# Patient Record
Sex: Female | Born: 1980 | Race: White | Hispanic: No | Marital: Married | Smoking: Never smoker
Health system: Southern US, Community
[De-identification: ages and names within clinical notes are randomized; demographics above are authoritative.]

## PROBLEM LIST (undated history)

## (undated) DIAGNOSIS — I82409 Acute embolism and thrombosis of unspecified deep veins of unspecified lower extremity: Secondary | ICD-10-CM

## (undated) DIAGNOSIS — Z9889 Other specified postprocedural states: Secondary | ICD-10-CM

## (undated) DIAGNOSIS — D682 Hereditary deficiency of other clotting factors: Secondary | ICD-10-CM

## (undated) DIAGNOSIS — I2699 Other pulmonary embolism without acute cor pulmonale: Secondary | ICD-10-CM

## (undated) DIAGNOSIS — R112 Nausea with vomiting, unspecified: Secondary | ICD-10-CM

## (undated) DIAGNOSIS — D6859 Other primary thrombophilia: Secondary | ICD-10-CM

## (undated) HISTORY — PX: TONSILLECTOMY: SUR1361

## (undated) HISTORY — PX: HYSTEROTOMY: SHX1776

## (undated) HISTORY — PX: APPENDECTOMY: SHX54

---

## 2003-03-14 ENCOUNTER — Ambulatory Visit (HOSPITAL_COMMUNITY): Admission: RE | Admit: 2003-03-14 | Discharge: 2003-03-14 | Payer: Self-pay | Admitting: Orthopedic Surgery

## 2003-03-16 ENCOUNTER — Inpatient Hospital Stay (HOSPITAL_COMMUNITY): Admission: EM | Admit: 2003-03-16 | Discharge: 2003-03-20 | Payer: Self-pay | Admitting: Emergency Medicine

## 2003-03-25 ENCOUNTER — Encounter: Admission: RE | Admit: 2003-03-25 | Discharge: 2003-03-25 | Payer: Self-pay | Admitting: Family Medicine

## 2003-03-27 ENCOUNTER — Encounter: Admission: RE | Admit: 2003-03-27 | Discharge: 2003-03-27 | Payer: Self-pay | Admitting: Family Medicine

## 2003-03-29 ENCOUNTER — Encounter: Admission: RE | Admit: 2003-03-29 | Discharge: 2003-03-29 | Payer: Self-pay | Admitting: Family Medicine

## 2003-04-01 ENCOUNTER — Encounter: Admission: RE | Admit: 2003-04-01 | Discharge: 2003-04-01 | Payer: Self-pay | Admitting: Family Medicine

## 2003-04-03 ENCOUNTER — Encounter: Admission: RE | Admit: 2003-04-03 | Discharge: 2003-04-03 | Payer: Self-pay | Admitting: Family Medicine

## 2003-04-08 ENCOUNTER — Encounter: Admission: RE | Admit: 2003-04-08 | Discharge: 2003-04-08 | Payer: Self-pay | Admitting: Family Medicine

## 2003-04-15 ENCOUNTER — Encounter: Admission: RE | Admit: 2003-04-15 | Discharge: 2003-04-15 | Payer: Self-pay | Admitting: Family Medicine

## 2003-04-25 ENCOUNTER — Encounter: Admission: RE | Admit: 2003-04-25 | Discharge: 2003-04-25 | Payer: Self-pay | Admitting: Family Medicine

## 2003-04-27 DIAGNOSIS — I82409 Acute embolism and thrombosis of unspecified deep veins of unspecified lower extremity: Secondary | ICD-10-CM

## 2003-04-27 HISTORY — DX: Acute embolism and thrombosis of unspecified deep veins of unspecified lower extremity: I82.409

## 2003-05-02 ENCOUNTER — Encounter: Admission: RE | Admit: 2003-05-02 | Discharge: 2003-05-02 | Payer: Self-pay | Admitting: Family Medicine

## 2003-05-10 ENCOUNTER — Encounter: Admission: RE | Admit: 2003-05-10 | Discharge: 2003-05-10 | Payer: Self-pay | Admitting: Family Medicine

## 2003-05-23 ENCOUNTER — Encounter: Admission: RE | Admit: 2003-05-23 | Discharge: 2003-05-23 | Payer: Self-pay | Admitting: Sports Medicine

## 2003-06-06 ENCOUNTER — Encounter: Admission: RE | Admit: 2003-06-06 | Discharge: 2003-06-06 | Payer: Self-pay | Admitting: Sports Medicine

## 2003-06-13 ENCOUNTER — Encounter: Admission: RE | Admit: 2003-06-13 | Discharge: 2003-06-13 | Payer: Self-pay | Admitting: Sports Medicine

## 2003-06-28 ENCOUNTER — Encounter: Admission: RE | Admit: 2003-06-28 | Discharge: 2003-06-28 | Payer: Self-pay | Admitting: Family Medicine

## 2003-07-22 ENCOUNTER — Encounter: Admission: RE | Admit: 2003-07-22 | Discharge: 2003-07-22 | Payer: Self-pay | Admitting: Family Medicine

## 2003-08-05 ENCOUNTER — Encounter: Admission: RE | Admit: 2003-08-05 | Discharge: 2003-08-05 | Payer: Self-pay | Admitting: Family Medicine

## 2003-08-06 ENCOUNTER — Encounter: Admission: RE | Admit: 2003-08-06 | Discharge: 2003-08-06 | Payer: Self-pay | Admitting: Sports Medicine

## 2003-08-23 ENCOUNTER — Encounter: Admission: RE | Admit: 2003-08-23 | Discharge: 2003-08-23 | Payer: Self-pay | Admitting: Family Medicine

## 2003-09-06 ENCOUNTER — Encounter: Admission: RE | Admit: 2003-09-06 | Discharge: 2003-09-06 | Payer: Self-pay | Admitting: Family Medicine

## 2003-09-26 ENCOUNTER — Encounter: Admission: RE | Admit: 2003-09-26 | Discharge: 2003-09-26 | Payer: Self-pay | Admitting: Family Medicine

## 2003-11-06 ENCOUNTER — Encounter: Admission: RE | Admit: 2003-11-06 | Discharge: 2003-11-06 | Payer: Self-pay | Admitting: Family Medicine

## 2003-12-09 ENCOUNTER — Encounter: Admission: RE | Admit: 2003-12-09 | Discharge: 2003-12-09 | Payer: Self-pay | Admitting: Family Medicine

## 2003-12-09 ENCOUNTER — Encounter: Admission: RE | Admit: 2003-12-09 | Discharge: 2003-12-09 | Payer: Self-pay | Admitting: Sports Medicine

## 2004-04-14 ENCOUNTER — Emergency Department (HOSPITAL_COMMUNITY): Admission: EM | Admit: 2004-04-14 | Discharge: 2004-04-14 | Payer: Self-pay | Admitting: Emergency Medicine

## 2004-05-06 ENCOUNTER — Ambulatory Visit: Payer: Self-pay | Admitting: Family Medicine

## 2004-05-15 ENCOUNTER — Ambulatory Visit: Payer: Self-pay | Admitting: Family Medicine

## 2004-05-15 ENCOUNTER — Inpatient Hospital Stay (HOSPITAL_COMMUNITY): Admission: EM | Admit: 2004-05-15 | Discharge: 2004-05-16 | Payer: Self-pay | Admitting: Emergency Medicine

## 2004-05-19 ENCOUNTER — Ambulatory Visit: Payer: Self-pay | Admitting: Family Medicine

## 2004-05-22 ENCOUNTER — Ambulatory Visit: Payer: Self-pay | Admitting: Family Medicine

## 2004-05-27 ENCOUNTER — Ambulatory Visit: Payer: Self-pay | Admitting: Family Medicine

## 2004-06-01 ENCOUNTER — Ambulatory Visit: Payer: Self-pay | Admitting: Family Medicine

## 2004-06-05 ENCOUNTER — Ambulatory Visit: Payer: Self-pay | Admitting: Sports Medicine

## 2004-06-10 ENCOUNTER — Ambulatory Visit: Payer: Self-pay | Admitting: Family Medicine

## 2004-06-15 ENCOUNTER — Ambulatory Visit: Payer: Self-pay | Admitting: Family Medicine

## 2004-06-25 ENCOUNTER — Ambulatory Visit: Payer: Self-pay | Admitting: Family Medicine

## 2004-07-01 ENCOUNTER — Ambulatory Visit: Payer: Self-pay | Admitting: Family Medicine

## 2004-07-08 ENCOUNTER — Ambulatory Visit: Payer: Self-pay | Admitting: Family Medicine

## 2004-07-15 ENCOUNTER — Ambulatory Visit: Payer: Self-pay | Admitting: Family Medicine

## 2004-07-30 ENCOUNTER — Ambulatory Visit: Payer: Self-pay | Admitting: Sports Medicine

## 2004-08-06 ENCOUNTER — Ambulatory Visit: Payer: Self-pay | Admitting: Family Medicine

## 2004-08-11 ENCOUNTER — Ambulatory Visit: Payer: Self-pay | Admitting: Family Medicine

## 2004-08-17 ENCOUNTER — Ambulatory Visit: Payer: Self-pay | Admitting: Sports Medicine

## 2004-08-27 ENCOUNTER — Ambulatory Visit: Payer: Self-pay | Admitting: Family Medicine

## 2004-09-08 ENCOUNTER — Ambulatory Visit: Payer: Self-pay | Admitting: Family Medicine

## 2004-09-15 ENCOUNTER — Ambulatory Visit: Payer: Self-pay | Admitting: Family Medicine

## 2004-09-28 ENCOUNTER — Ambulatory Visit: Payer: Self-pay | Admitting: Sports Medicine

## 2004-09-30 ENCOUNTER — Encounter: Admission: RE | Admit: 2004-09-30 | Discharge: 2004-09-30 | Payer: Self-pay | Admitting: Obstetrics and Gynecology

## 2004-10-04 ENCOUNTER — Emergency Department (HOSPITAL_COMMUNITY): Admission: EM | Admit: 2004-10-04 | Discharge: 2004-10-05 | Payer: Self-pay | Admitting: Emergency Medicine

## 2004-10-21 ENCOUNTER — Ambulatory Visit: Payer: Self-pay | Admitting: Family Medicine

## 2004-10-29 ENCOUNTER — Ambulatory Visit: Payer: Self-pay | Admitting: Family Medicine

## 2004-11-13 ENCOUNTER — Ambulatory Visit: Payer: Self-pay | Admitting: Family Medicine

## 2004-12-03 ENCOUNTER — Ambulatory Visit: Payer: Self-pay | Admitting: Family Medicine

## 2004-12-29 ENCOUNTER — Ambulatory Visit: Payer: Self-pay | Admitting: Family Medicine

## 2005-01-07 ENCOUNTER — Ambulatory Visit: Payer: Self-pay | Admitting: Family Medicine

## 2005-01-20 ENCOUNTER — Ambulatory Visit: Payer: Self-pay | Admitting: Family Medicine

## 2005-01-29 ENCOUNTER — Encounter (INDEPENDENT_AMBULATORY_CARE_PROVIDER_SITE_OTHER): Payer: Self-pay | Admitting: *Deleted

## 2005-01-29 LAB — CONVERTED CEMR LAB

## 2005-02-19 ENCOUNTER — Ambulatory Visit: Payer: Self-pay | Admitting: Family Medicine

## 2005-03-23 ENCOUNTER — Ambulatory Visit: Payer: Self-pay | Admitting: Sports Medicine

## 2005-04-07 ENCOUNTER — Ambulatory Visit: Payer: Self-pay | Admitting: Family Medicine

## 2005-05-18 ENCOUNTER — Ambulatory Visit: Payer: Self-pay | Admitting: Family Medicine

## 2005-05-26 ENCOUNTER — Emergency Department (HOSPITAL_COMMUNITY): Admission: EM | Admit: 2005-05-26 | Discharge: 2005-05-26 | Payer: Self-pay | Admitting: Emergency Medicine

## 2005-07-08 ENCOUNTER — Ambulatory Visit: Payer: Self-pay | Admitting: Family Medicine

## 2005-08-17 ENCOUNTER — Ambulatory Visit: Payer: Self-pay | Admitting: Family Medicine

## 2005-08-20 ENCOUNTER — Ambulatory Visit: Payer: Self-pay | Admitting: Family Medicine

## 2005-08-25 ENCOUNTER — Ambulatory Visit: Payer: Self-pay | Admitting: Family Medicine

## 2005-09-03 ENCOUNTER — Ambulatory Visit: Payer: Self-pay | Admitting: Family Medicine

## 2005-10-13 ENCOUNTER — Ambulatory Visit: Payer: Self-pay | Admitting: Family Medicine

## 2005-11-10 ENCOUNTER — Ambulatory Visit: Payer: Self-pay | Admitting: Family Medicine

## 2006-02-15 ENCOUNTER — Ambulatory Visit: Payer: Self-pay | Admitting: Family Medicine

## 2006-02-17 ENCOUNTER — Ambulatory Visit: Payer: Self-pay | Admitting: Family Medicine

## 2006-02-28 ENCOUNTER — Ambulatory Visit: Payer: Self-pay | Admitting: Sports Medicine

## 2006-03-02 ENCOUNTER — Emergency Department (HOSPITAL_COMMUNITY): Admission: EM | Admit: 2006-03-02 | Discharge: 2006-03-02 | Payer: Self-pay | Admitting: Emergency Medicine

## 2006-03-30 ENCOUNTER — Ambulatory Visit: Payer: Self-pay | Admitting: Family Medicine

## 2006-04-26 HISTORY — PX: DIAGNOSTIC LAPAROSCOPY: SUR761

## 2006-04-28 ENCOUNTER — Ambulatory Visit: Payer: Self-pay | Admitting: Family Medicine

## 2006-05-12 ENCOUNTER — Ambulatory Visit: Payer: Self-pay | Admitting: Family Medicine

## 2006-05-26 ENCOUNTER — Ambulatory Visit: Payer: Self-pay | Admitting: Family Medicine

## 2006-06-01 ENCOUNTER — Ambulatory Visit: Payer: Self-pay | Admitting: Family Medicine

## 2006-06-15 ENCOUNTER — Emergency Department (HOSPITAL_COMMUNITY): Admission: EM | Admit: 2006-06-15 | Discharge: 2006-06-15 | Payer: Self-pay | Admitting: Emergency Medicine

## 2006-06-22 ENCOUNTER — Ambulatory Visit: Payer: Self-pay | Admitting: Family Medicine

## 2006-06-23 DIAGNOSIS — N921 Excessive and frequent menstruation with irregular cycle: Secondary | ICD-10-CM | POA: Insufficient documentation

## 2006-06-23 DIAGNOSIS — N920 Excessive and frequent menstruation with regular cycle: Secondary | ICD-10-CM

## 2006-06-23 DIAGNOSIS — E282 Polycystic ovarian syndrome: Secondary | ICD-10-CM

## 2006-06-23 DIAGNOSIS — M542 Cervicalgia: Secondary | ICD-10-CM

## 2006-06-23 DIAGNOSIS — I80299 Phlebitis and thrombophlebitis of other deep vessels of unspecified lower extremity: Secondary | ICD-10-CM

## 2006-06-23 DIAGNOSIS — F329 Major depressive disorder, single episode, unspecified: Secondary | ICD-10-CM

## 2006-06-23 DIAGNOSIS — E669 Obesity, unspecified: Secondary | ICD-10-CM | POA: Insufficient documentation

## 2006-06-24 ENCOUNTER — Encounter (INDEPENDENT_AMBULATORY_CARE_PROVIDER_SITE_OTHER): Payer: Self-pay | Admitting: *Deleted

## 2006-07-08 ENCOUNTER — Ambulatory Visit: Payer: Self-pay | Admitting: Family Medicine

## 2006-07-08 DIAGNOSIS — I2699 Other pulmonary embolism without acute cor pulmonale: Secondary | ICD-10-CM

## 2006-07-08 LAB — CONVERTED CEMR LAB
INR: 2.7
Prothrombin Time: 32.9 s

## 2006-07-29 ENCOUNTER — Encounter (INDEPENDENT_AMBULATORY_CARE_PROVIDER_SITE_OTHER): Payer: Self-pay | Admitting: *Deleted

## 2006-07-29 ENCOUNTER — Ambulatory Visit: Payer: Self-pay | Admitting: Family Medicine

## 2006-07-29 LAB — CONVERTED CEMR LAB: INR: 2.1

## 2006-08-01 ENCOUNTER — Encounter (INDEPENDENT_AMBULATORY_CARE_PROVIDER_SITE_OTHER): Payer: Self-pay | Admitting: *Deleted

## 2006-08-01 ENCOUNTER — Telehealth (INDEPENDENT_AMBULATORY_CARE_PROVIDER_SITE_OTHER): Payer: Self-pay | Admitting: *Deleted

## 2006-08-02 ENCOUNTER — Encounter: Payer: Self-pay | Admitting: *Deleted

## 2006-08-03 ENCOUNTER — Encounter (INDEPENDENT_AMBULATORY_CARE_PROVIDER_SITE_OTHER): Payer: Self-pay | Admitting: *Deleted

## 2006-08-03 ENCOUNTER — Encounter: Payer: Self-pay | Admitting: *Deleted

## 2006-08-03 LAB — CONVERTED CEMR LAB: Pap Smear: NORMAL

## 2006-08-09 ENCOUNTER — Encounter (INDEPENDENT_AMBULATORY_CARE_PROVIDER_SITE_OTHER): Payer: Self-pay | Admitting: *Deleted

## 2006-08-09 ENCOUNTER — Ambulatory Visit (HOSPITAL_COMMUNITY): Admission: RE | Admit: 2006-08-09 | Discharge: 2006-08-09 | Payer: Self-pay | Admitting: *Deleted

## 2006-08-10 ENCOUNTER — Telehealth (INDEPENDENT_AMBULATORY_CARE_PROVIDER_SITE_OTHER): Payer: Self-pay | Admitting: *Deleted

## 2006-08-16 ENCOUNTER — Telehealth (INDEPENDENT_AMBULATORY_CARE_PROVIDER_SITE_OTHER): Payer: Self-pay | Admitting: *Deleted

## 2006-08-22 ENCOUNTER — Encounter: Payer: Self-pay | Admitting: *Deleted

## 2006-08-24 ENCOUNTER — Telehealth: Payer: Self-pay | Admitting: *Deleted

## 2006-08-25 ENCOUNTER — Ambulatory Visit: Payer: Self-pay | Admitting: Sports Medicine

## 2006-08-25 ENCOUNTER — Telehealth: Payer: Self-pay | Admitting: *Deleted

## 2006-08-25 ENCOUNTER — Encounter: Payer: Self-pay | Admitting: Family Medicine

## 2006-08-25 DIAGNOSIS — H9209 Otalgia, unspecified ear: Secondary | ICD-10-CM | POA: Insufficient documentation

## 2006-08-25 DIAGNOSIS — R197 Diarrhea, unspecified: Secondary | ICD-10-CM

## 2006-08-31 ENCOUNTER — Ambulatory Visit: Payer: Self-pay | Admitting: Family Medicine

## 2006-08-31 LAB — CONVERTED CEMR LAB: INR: 2.2

## 2006-09-02 ENCOUNTER — Encounter (INDEPENDENT_AMBULATORY_CARE_PROVIDER_SITE_OTHER): Payer: Self-pay | Admitting: *Deleted

## 2006-09-05 ENCOUNTER — Ambulatory Visit: Payer: Self-pay | Admitting: Sports Medicine

## 2006-09-06 ENCOUNTER — Telehealth (INDEPENDENT_AMBULATORY_CARE_PROVIDER_SITE_OTHER): Payer: Self-pay | Admitting: *Deleted

## 2006-09-06 ENCOUNTER — Ambulatory Visit: Payer: Self-pay | Admitting: Oncology

## 2006-09-08 ENCOUNTER — Ambulatory Visit: Payer: Self-pay | Admitting: Obstetrics and Gynecology

## 2006-09-13 LAB — PROTIME-INR (CHCC SATELLITE): INR: 3.2 (ref 2.0–3.5)

## 2006-09-13 LAB — CBC WITH DIFFERENTIAL (CANCER CENTER ONLY)
BASO#: 0 10*3/uL (ref 0.0–0.2)
EOS%: 2.9 % (ref 0.0–7.0)
Eosinophils Absolute: 0.2 10*3/uL (ref 0.0–0.5)
HGB: 13.1 g/dL (ref 11.6–15.9)
LYMPH#: 2.1 10*3/uL (ref 0.9–3.3)
NEUT#: 3.1 10*3/uL (ref 1.5–6.5)
RBC: 4.36 10*6/uL (ref 3.70–5.32)
WBC: 5.6 10*3/uL (ref 3.9–10.0)

## 2006-09-16 LAB — COMPREHENSIVE METABOLIC PANEL
ALT: 23 U/L (ref 0–35)
AST: 22 U/L (ref 0–37)
Alkaline Phosphatase: 63 U/L (ref 39–117)
CO2: 24 mEq/L (ref 19–32)
Sodium: 137 mEq/L (ref 135–145)
Total Bilirubin: 0.4 mg/dL (ref 0.3–1.2)
Total Protein: 6.4 g/dL (ref 6.0–8.3)

## 2006-09-16 LAB — HYPERCOAGULABLE PANEL, COMPREHENSIVE
AntiThromb III Func: 102 % (ref 75–120)
Anticardiolipin IgA: <7 [APL'U] (ref <13)
DRVVT 1:1 Mix: 42.6 secs (ref 36.1–47.0)
DRVVT: 88.7 secs — ABNORMAL HIGH (ref 36.1–47.0)
Homocysteine: 5.8 umol/L (ref 4.0–15.4)
Lupus Anticoagulant: DETECTED
PTTLA Confirmation: 14.8 secs — ABNORMAL HIGH (ref <8.0)
Protein C Activity: 15 % — ABNORMAL LOW (ref 91–147)
Protein C, Total: 39 % — ABNORMAL LOW (ref 70–140)
Protein S Activity: 42 % — ABNORMAL LOW (ref 81–180)
Protein S Ag, Total: 68 % — ABNORMAL LOW (ref 70–140)

## 2006-09-20 ENCOUNTER — Telehealth: Payer: Self-pay | Admitting: *Deleted

## 2006-09-21 ENCOUNTER — Encounter (INDEPENDENT_AMBULATORY_CARE_PROVIDER_SITE_OTHER): Payer: Self-pay | Admitting: *Deleted

## 2006-09-21 ENCOUNTER — Ambulatory Visit: Payer: Self-pay | Admitting: Family Medicine

## 2006-09-29 ENCOUNTER — Ambulatory Visit: Payer: Self-pay | Admitting: Family Medicine

## 2006-09-29 LAB — CONVERTED CEMR LAB: INR: 2.3

## 2006-11-18 ENCOUNTER — Ambulatory Visit: Payer: Self-pay | Admitting: Family Medicine

## 2006-11-18 LAB — CONVERTED CEMR LAB: INR: 4.3

## 2006-11-21 ENCOUNTER — Encounter (INDEPENDENT_AMBULATORY_CARE_PROVIDER_SITE_OTHER): Payer: Self-pay | Admitting: *Deleted

## 2006-11-23 ENCOUNTER — Encounter (INDEPENDENT_AMBULATORY_CARE_PROVIDER_SITE_OTHER): Payer: Self-pay | Admitting: *Deleted

## 2006-11-23 DIAGNOSIS — R7402 Elevation of levels of lactic acid dehydrogenase (LDH): Secondary | ICD-10-CM | POA: Insufficient documentation

## 2006-11-23 DIAGNOSIS — R740 Nonspecific elevation of levels of transaminase and lactic acid dehydrogenase [LDH]: Secondary | ICD-10-CM

## 2006-11-25 ENCOUNTER — Ambulatory Visit: Payer: Self-pay | Admitting: Family Medicine

## 2006-11-25 ENCOUNTER — Encounter (INDEPENDENT_AMBULATORY_CARE_PROVIDER_SITE_OTHER): Payer: Self-pay | Admitting: *Deleted

## 2006-11-25 DIAGNOSIS — R42 Dizziness and giddiness: Secondary | ICD-10-CM

## 2006-11-28 LAB — CONVERTED CEMR LAB
ALT: 40 units/L — ABNORMAL HIGH (ref 0–35)
AST: 25 units/L (ref 0–37)
Albumin: 4.6 g/dL (ref 3.5–5.2)
Alkaline Phosphatase: 64 units/L (ref 39–117)
BUN: 11 mg/dL (ref 6–23)
Basophils Absolute: 0 10*3/uL (ref 0.0–0.1)
Basophils Relative: 1 % (ref 0–1)
CO2: 23 meq/L (ref 19–32)
Calcium: 9.5 mg/dL (ref 8.4–10.5)
Chloride: 105 meq/L (ref 96–112)
Creatinine, Ser: 0.8 mg/dL (ref 0.40–1.20)
Eosinophils Absolute: 0.1 10*3/uL (ref 0.0–0.7)
Eosinophils Relative: 2 % (ref 0–5)
Glucose, Bld: 66 mg/dL — ABNORMAL LOW (ref 70–99)
HCT: 41.9 % (ref 36.0–46.0)
Hemoglobin: 14.2 g/dL (ref 12.0–15.0)
Lymphocytes Relative: 29 % (ref 12–46)
Lymphs Abs: 1.6 10*3/uL (ref 0.7–3.3)
MCHC: 33.9 g/dL (ref 30.0–36.0)
MCV: 88.8 fL (ref 78.0–100.0)
Monocytes Absolute: 0.5 10*3/uL (ref 0.2–0.7)
Monocytes Relative: 9 % (ref 3–11)
Neutro Abs: 3.3 10*3/uL (ref 1.7–7.7)
Neutrophils Relative %: 60 % (ref 43–77)
Platelets: 206 10*3/uL (ref 150–400)
Potassium: 4.5 meq/L (ref 3.5–5.3)
RBC: 4.72 M/uL (ref 3.87–5.11)
RDW: 13 % (ref 11.5–14.0)
Sodium: 143 meq/L (ref 135–145)
Total Bilirubin: 0.4 mg/dL (ref 0.3–1.2)
Total Protein: 7.2 g/dL (ref 6.0–8.3)
WBC: 5.6 10*3/uL (ref 4.0–10.5)
aPTT: 44 s — ABNORMAL HIGH (ref 24–37)

## 2006-11-30 ENCOUNTER — Encounter (INDEPENDENT_AMBULATORY_CARE_PROVIDER_SITE_OTHER): Payer: Self-pay | Admitting: *Deleted

## 2006-12-01 ENCOUNTER — Telehealth (INDEPENDENT_AMBULATORY_CARE_PROVIDER_SITE_OTHER): Payer: Self-pay | Admitting: *Deleted

## 2006-12-05 ENCOUNTER — Telehealth (INDEPENDENT_AMBULATORY_CARE_PROVIDER_SITE_OTHER): Payer: Self-pay | Admitting: *Deleted

## 2006-12-06 ENCOUNTER — Encounter (INDEPENDENT_AMBULATORY_CARE_PROVIDER_SITE_OTHER): Payer: Self-pay | Admitting: *Deleted

## 2006-12-07 ENCOUNTER — Encounter (INDEPENDENT_AMBULATORY_CARE_PROVIDER_SITE_OTHER): Payer: Self-pay | Admitting: *Deleted

## 2006-12-09 ENCOUNTER — Ambulatory Visit: Payer: Self-pay | Admitting: Family Medicine

## 2006-12-09 LAB — CONVERTED CEMR LAB: INR: 2.1

## 2007-01-12 ENCOUNTER — Ambulatory Visit: Payer: Self-pay | Admitting: Family Medicine

## 2007-01-12 LAB — CONVERTED CEMR LAB: INR: 2.2

## 2007-02-01 ENCOUNTER — Ambulatory Visit (HOSPITAL_COMMUNITY): Admission: RE | Admit: 2007-02-01 | Discharge: 2007-02-01 | Payer: Self-pay | Admitting: Gynecology

## 2007-02-07 ENCOUNTER — Ambulatory Visit: Payer: Self-pay | Admitting: Family Medicine

## 2007-02-07 DIAGNOSIS — Z86718 Personal history of other venous thrombosis and embolism: Secondary | ICD-10-CM

## 2007-02-07 LAB — CONVERTED CEMR LAB: INR: 2.1

## 2007-02-08 ENCOUNTER — Telehealth (INDEPENDENT_AMBULATORY_CARE_PROVIDER_SITE_OTHER): Payer: Self-pay | Admitting: *Deleted

## 2007-03-01 ENCOUNTER — Telehealth (INDEPENDENT_AMBULATORY_CARE_PROVIDER_SITE_OTHER): Payer: Self-pay | Admitting: *Deleted

## 2007-03-22 ENCOUNTER — Ambulatory Visit: Payer: Self-pay | Admitting: Family Medicine

## 2007-03-22 LAB — CONVERTED CEMR LAB: INR: 1.6

## 2007-03-28 ENCOUNTER — Emergency Department (HOSPITAL_COMMUNITY): Admission: EM | Admit: 2007-03-28 | Discharge: 2007-03-29 | Payer: Self-pay | Admitting: Emergency Medicine

## 2007-03-28 ENCOUNTER — Telehealth (INDEPENDENT_AMBULATORY_CARE_PROVIDER_SITE_OTHER): Payer: Self-pay | Admitting: Family Medicine

## 2007-03-28 ENCOUNTER — Ambulatory Visit (HOSPITAL_COMMUNITY): Admission: RE | Admit: 2007-03-28 | Discharge: 2007-03-28 | Payer: Self-pay | Admitting: Gynecology

## 2007-03-31 ENCOUNTER — Telehealth (INDEPENDENT_AMBULATORY_CARE_PROVIDER_SITE_OTHER): Payer: Self-pay | Admitting: Family Medicine

## 2007-04-05 ENCOUNTER — Ambulatory Visit: Payer: Self-pay | Admitting: Family Medicine

## 2007-04-05 LAB — CONVERTED CEMR LAB: INR: 1.8

## 2007-04-12 ENCOUNTER — Ambulatory Visit: Payer: Self-pay | Admitting: Family Medicine

## 2007-04-18 ENCOUNTER — Ambulatory Visit: Payer: Self-pay | Admitting: Sports Medicine

## 2007-04-18 LAB — CONVERTED CEMR LAB: INR: 1.8

## 2007-04-25 ENCOUNTER — Encounter (INDEPENDENT_AMBULATORY_CARE_PROVIDER_SITE_OTHER): Payer: Self-pay | Admitting: *Deleted

## 2007-05-02 ENCOUNTER — Ambulatory Visit: Payer: Self-pay | Admitting: Family Medicine

## 2007-05-02 LAB — CONVERTED CEMR LAB: INR: 2

## 2007-05-18 ENCOUNTER — Encounter (INDEPENDENT_AMBULATORY_CARE_PROVIDER_SITE_OTHER): Payer: Self-pay | Admitting: Family Medicine

## 2007-09-27 ENCOUNTER — Ambulatory Visit: Payer: Self-pay | Admitting: Oncology

## 2007-10-02 LAB — CBC WITH DIFFERENTIAL (CANCER CENTER ONLY)
BASO#: 0.1 10*3/uL (ref 0.0–0.2)
EOS%: 4.6 % (ref 0.0–7.0)
HCT: 37.7 % (ref 34.8–46.6)
HGB: 13.1 g/dL (ref 11.6–15.9)
LYMPH#: 1.8 10*3/uL (ref 0.9–3.3)
LYMPH%: 34 % (ref 14.0–48.0)
MCHC: 34.8 g/dL (ref 32.0–36.0)
MCV: 87 fL (ref 81–101)
NEUT%: 54.2 % (ref 39.6–80.0)

## 2007-10-02 LAB — PROTIME-INR (CHCC SATELLITE): Protime: 22.8 Seconds — ABNORMAL HIGH (ref 10.6–13.4)

## 2007-10-04 LAB — PROTIME-INR (CHCC SATELLITE): INR: 1.3 — ABNORMAL LOW (ref 2.0–3.5)

## 2007-10-11 LAB — PROTIME-INR (CHCC SATELLITE)
INR: 1 — ABNORMAL LOW (ref 2.0–3.5)
Protime: 12 Seconds (ref 10.6–13.4)

## 2007-10-19 LAB — CBC WITH DIFFERENTIAL (CANCER CENTER ONLY)
BASO#: 0.1 10*3/uL (ref 0.0–0.2)
BASO%: 1.8 % (ref 0.0–2.0)
Eosinophils Absolute: 0.2 10*3/uL (ref 0.0–0.5)
HCT: 37.7 % (ref 34.8–46.6)
HGB: 13.1 g/dL (ref 11.6–15.9)
LYMPH#: 2.4 10*3/uL (ref 0.9–3.3)
MCV: 87 fL (ref 81–101)
MONO#: 0.4 10*3/uL (ref 0.1–0.9)
NEUT%: 53.6 % (ref 39.6–80.0)
RBC: 4.31 10*6/uL (ref 3.70–5.32)
RDW: 11.8 % (ref 10.5–14.6)
WBC: 6.9 10*3/uL (ref 3.9–10.0)

## 2007-10-19 LAB — PROTIME-INR (CHCC SATELLITE)
INR: 1.1 — ABNORMAL LOW (ref 2.0–3.5)
Protime: 13.2 Seconds (ref 10.6–13.4)

## 2007-10-23 LAB — PROTIME-INR (CHCC SATELLITE)

## 2007-10-26 LAB — PROTIME-INR (CHCC SATELLITE): Protime: 27.6 Seconds — ABNORMAL HIGH (ref 10.6–13.4)

## 2007-11-10 ENCOUNTER — Ambulatory Visit: Payer: Self-pay | Admitting: Oncology

## 2007-12-07 ENCOUNTER — Ambulatory Visit (HOSPITAL_COMMUNITY): Admission: RE | Admit: 2007-12-07 | Discharge: 2007-12-07 | Payer: Self-pay | Admitting: Gynecology

## 2008-11-14 ENCOUNTER — Ambulatory Visit: Payer: Self-pay | Admitting: Oncology

## 2008-11-18 LAB — COMPREHENSIVE METABOLIC PANEL
Albumin: 4 g/dL (ref 3.5–5.2)
BUN: 7 mg/dL (ref 6–23)
CO2: 30 mEq/L (ref 19–32)
Calcium: 9.3 mg/dL (ref 8.4–10.5)
Chloride: 104 mEq/L (ref 96–112)
Creatinine, Ser: 0.65 mg/dL (ref 0.40–1.20)

## 2008-11-18 LAB — CBC WITH DIFFERENTIAL (CANCER CENTER ONLY)
BASO%: 0.5 % (ref 0.0–2.0)
Eosinophils Absolute: 0.2 10*3/uL (ref 0.0–0.5)
LYMPH#: 2 10*3/uL (ref 0.9–3.3)
MONO#: 0.4 10*3/uL (ref 0.1–0.9)
Platelets: 227 10*3/uL (ref 145–400)
RBC: 4.26 10*6/uL (ref 3.70–5.32)
RDW: 12 % (ref 10.5–14.6)
WBC: 6 10*3/uL (ref 3.9–10.0)

## 2008-12-03 LAB — CBC WITH DIFFERENTIAL (CANCER CENTER ONLY)
BASO%: 0.7 % (ref 0.0–2.0)
Eosinophils Absolute: 0.2 10*3/uL (ref 0.0–0.5)
LYMPH%: 35.4 % (ref 14.0–48.0)
MCH: 30.6 pg (ref 26.0–34.0)
MCV: 88 fL (ref 81–101)
MONO#: 0.4 10*3/uL (ref 0.1–0.9)
MONO%: 6.9 % (ref 0.0–13.0)
NEUT#: 3.3 10*3/uL (ref 1.5–6.5)
Platelets: 214 10*3/uL (ref 145–400)
RBC: 4.36 10*6/uL (ref 3.70–5.32)
RDW: 12 % (ref 10.5–14.6)
WBC: 6 10*3/uL (ref 3.9–10.0)

## 2008-12-03 LAB — BASIC METABOLIC PANEL - CANCER CENTER ONLY
BUN, Bld: 13 mg/dL (ref 7–22)
CO2: 29 mEq/L (ref 18–33)
Calcium: 9.1 mg/dL (ref 8.0–10.3)
Creat: 0.6 mg/dl (ref 0.6–1.2)
Glucose, Bld: 86 mg/dL (ref 73–118)

## 2009-03-01 IMAGING — RF DG HYSTEROGRAM
6 series · 6 of 6 positions shown · non-contrast
Comparison: 02/01/07.

CLINICAL DATA: Infertility.  
 HYSTEROSALPINGOGRAM:
 Exam is performed by Dr. Moatshe.  Six images are submitted for interpretation.

[Series 1: run · 1 of 1 slices shown (1 of 6)]
[im 1/1]
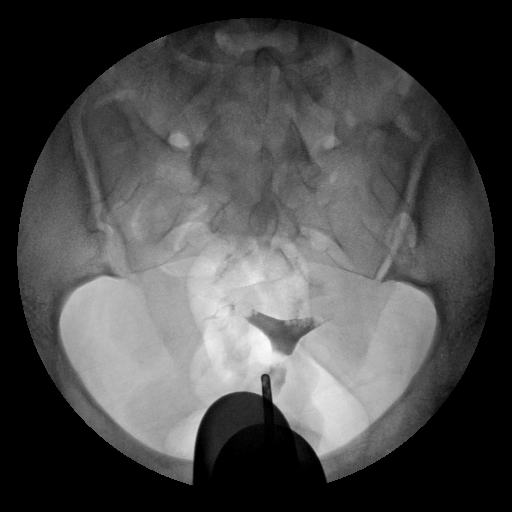

[Series 2: run · 1 of 1 slices shown (2 of 6)]
[im 1/1]
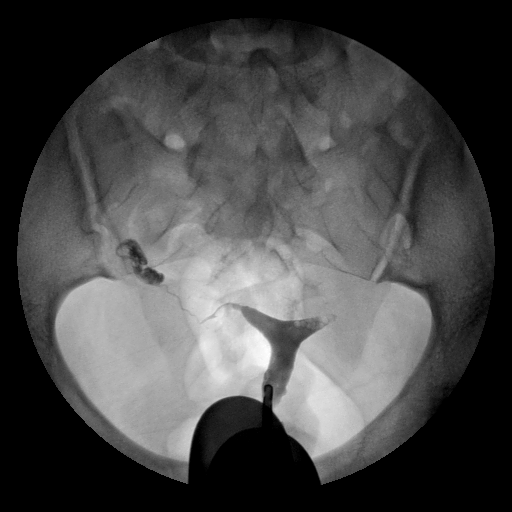

[Series 3: run · 1 of 1 slices shown (3 of 6)]
[im 1/1]
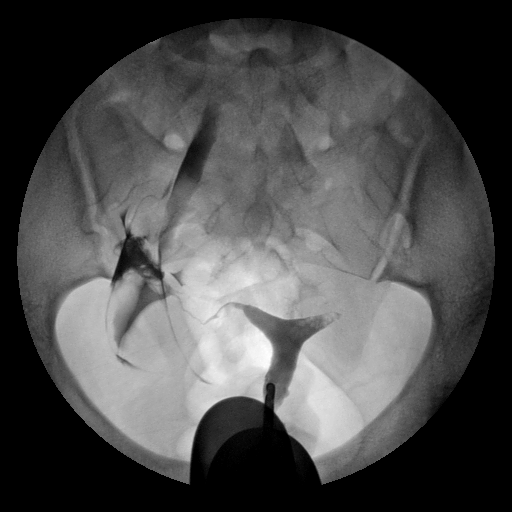

[Series 4: run · 1 of 1 slices shown (4 of 6)]
[im 1/1]
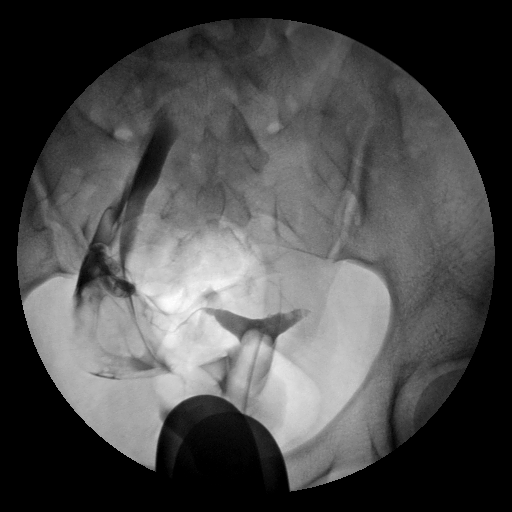

[Series 5: run · 1 of 1 slices shown (5 of 6)]
[im 1/1]
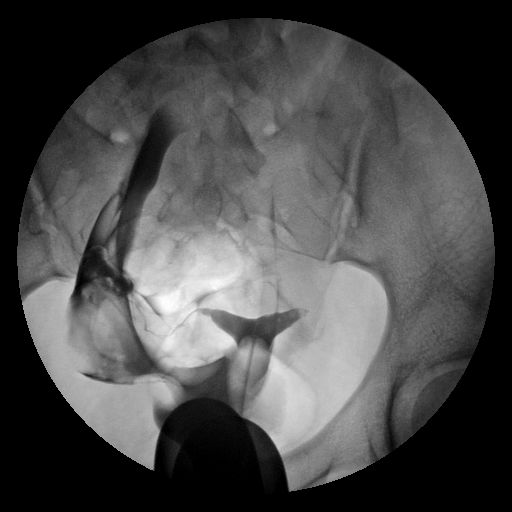

[Series 6: run · 1 of 1 slices shown (6 of 6)]
[im 1/1]
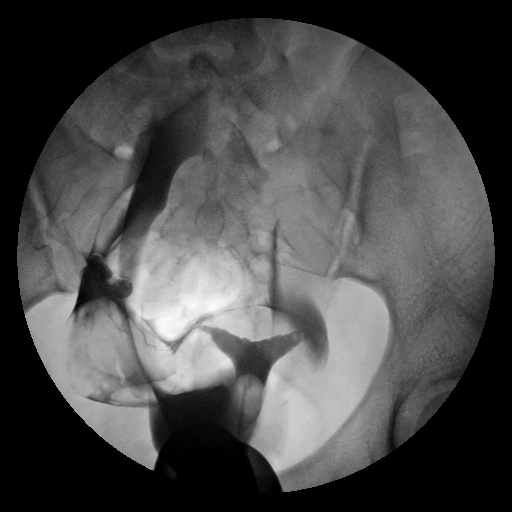

[6 of 6 positions shown; findings below may reference images not displayed]

FINDINGS: The uterus has a normal appearance.  Air bubbles are seen within both cornual regions which mostly dissipate during the exam.  The right fallopian tube fills and has a normal appearance.  There is free spill of contrast in the right adnexal region.  Again, the left fallopian tube does not fill and appears blocked proximally.
IMPRESSION: 1.  Normal uterus and patent right fallopian tube. 
 2.  Again, the left fallopian tube is blocked.

## 2009-03-01 IMAGING — CT CT ANGIO CHEST
2 of 5 series · 19 of 36 positions shown · IV contrast (APPLIED)
Comparison: 03/02/2006

CLINICAL DATA: Chest pain

CT ANGIOGRAPHY OF CHEST - PULMONARY EMBOLISM PROTOCOL:
TECHNIQUE: Multidetector CT imaging of the chest was performed according to the
protocol for detection of pulmonary embolism during bolus injection of
intravenous contrast.  Coronal and sagittal plane CT angiographic image
reconstructions were also generated.
Contrast:  100 cc Omnipaque 300

[Series 6: pulm embolism 2.0 cor · coronal · 0.59mm/px · 3 of 107 slices shown]
[im 22/107  mediastinal]
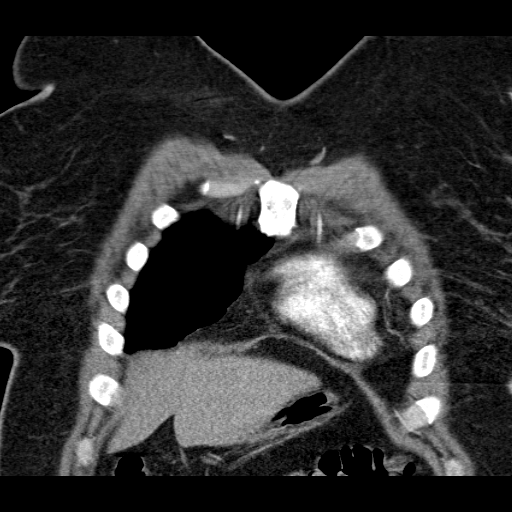
[im 43/107  mediastinal]
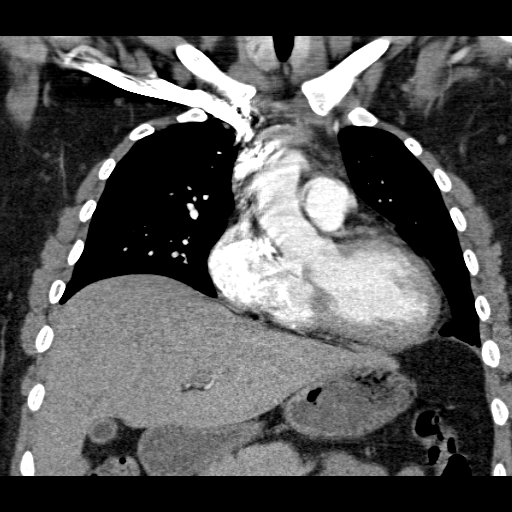
[im 64/107  mediastinal]
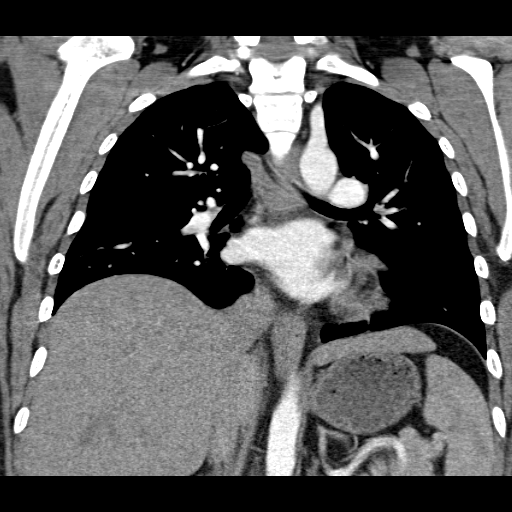

[Series 8: pulm embolism 1.0 thins · axial · 0.60mm/px · z∈[-318,-88]mm · 16 of 262 slices shown]
[im 16/262  lung]
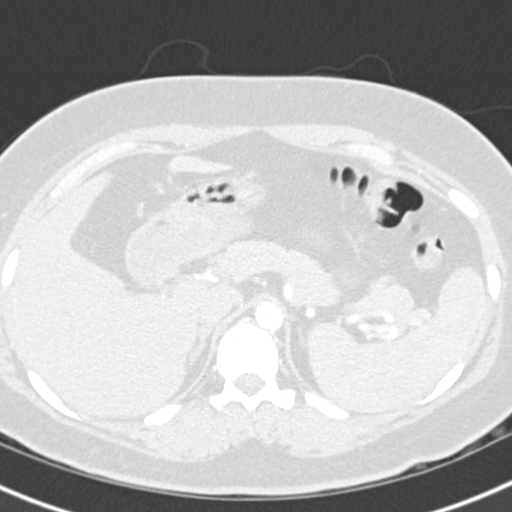
[im 31/262  mediastinal]
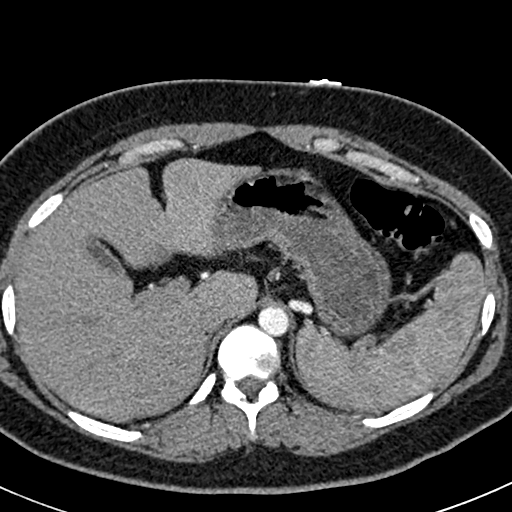
[im 47/262  lung]
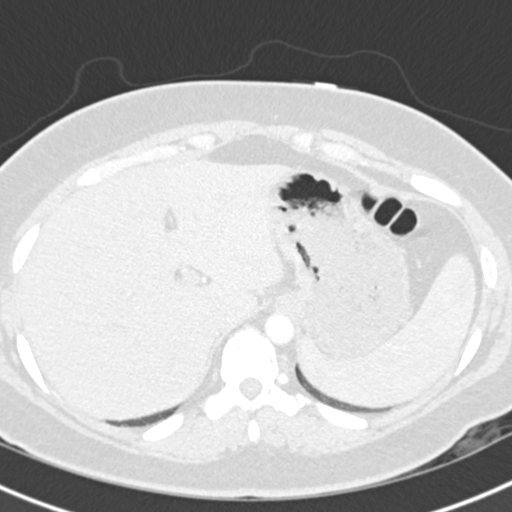
[im 62/262  mediastinal]
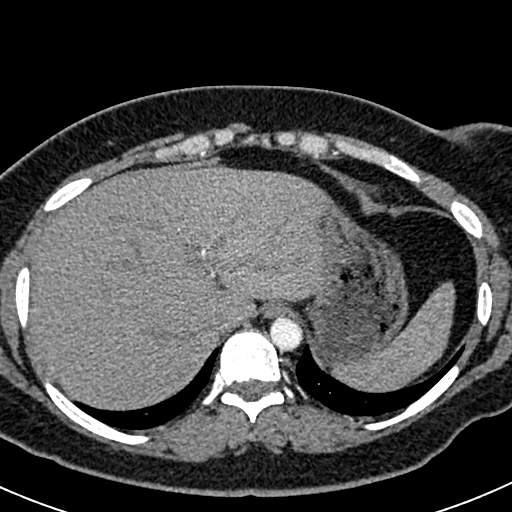
[im 77/262  lung]
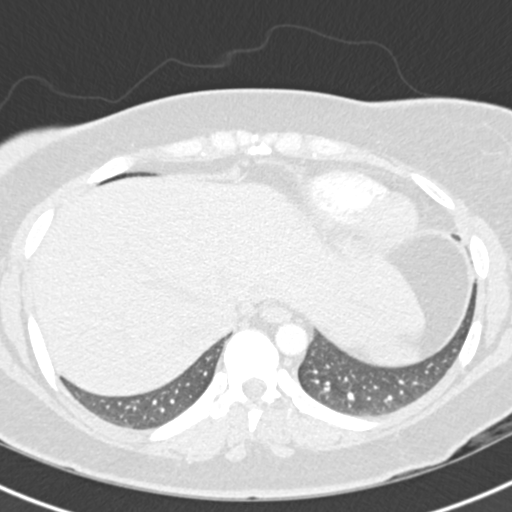
[im 93/262  mediastinal]
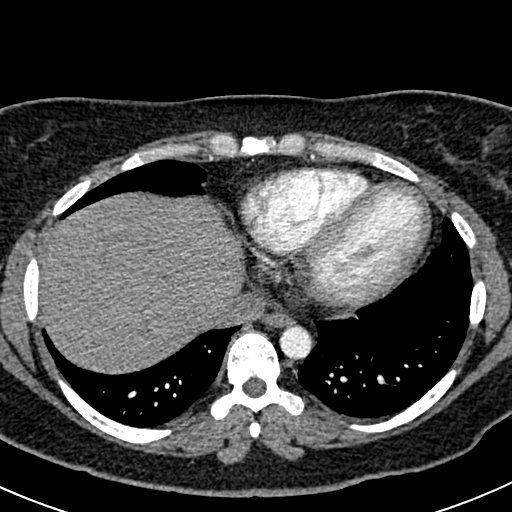
[im 108/262  lung]
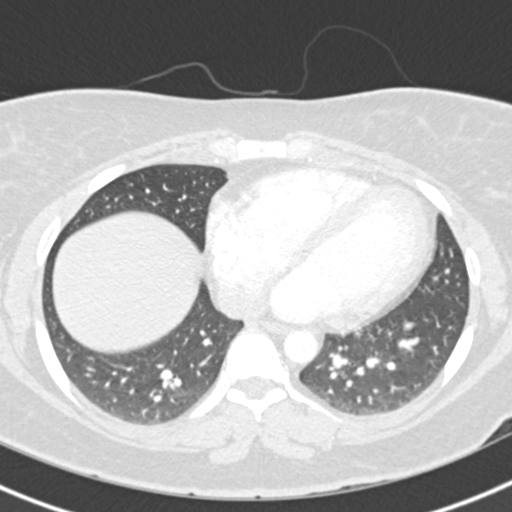
[im 123/262  mediastinal]
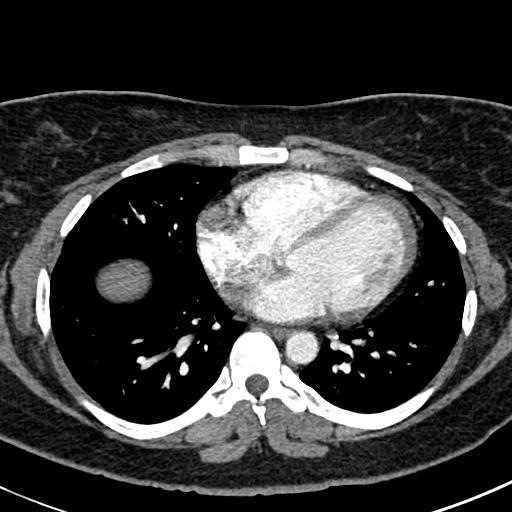
[im 139/262  lung]
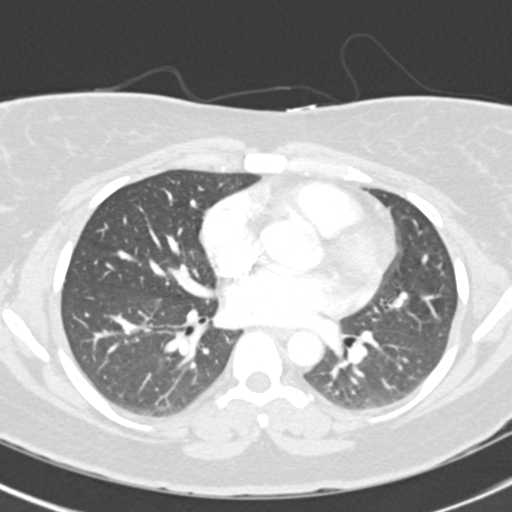
[im 154/262  mediastinal]
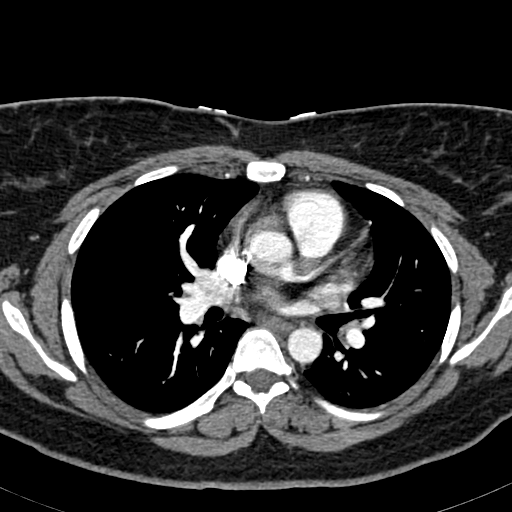
[im 169/262  lung]
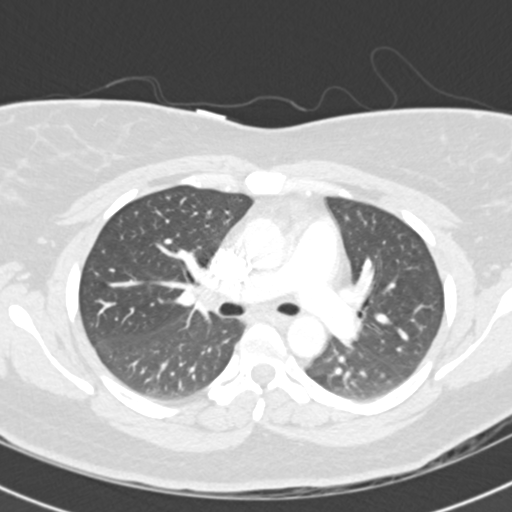
[im 185/262  mediastinal]
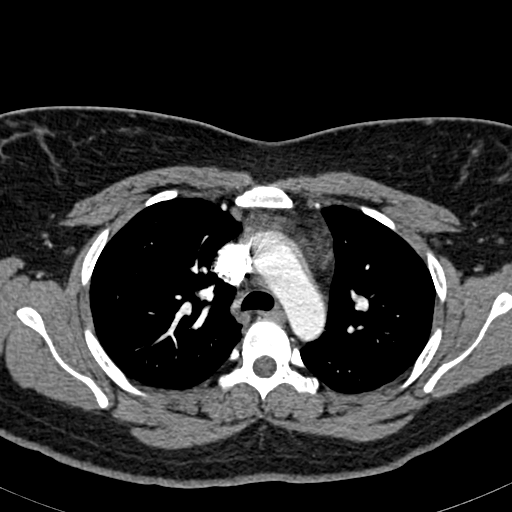
[im 200/262  lung]
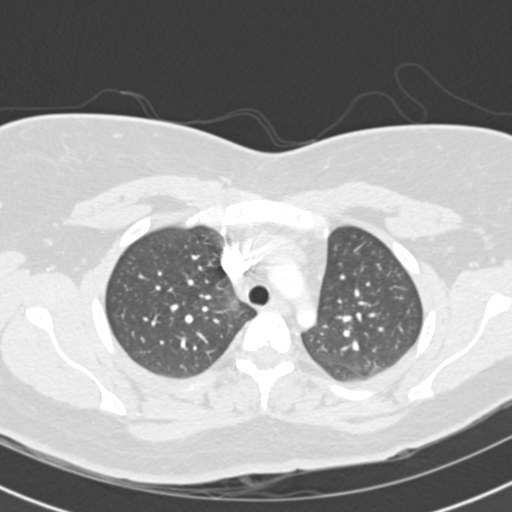
[im 215/262  mediastinal]
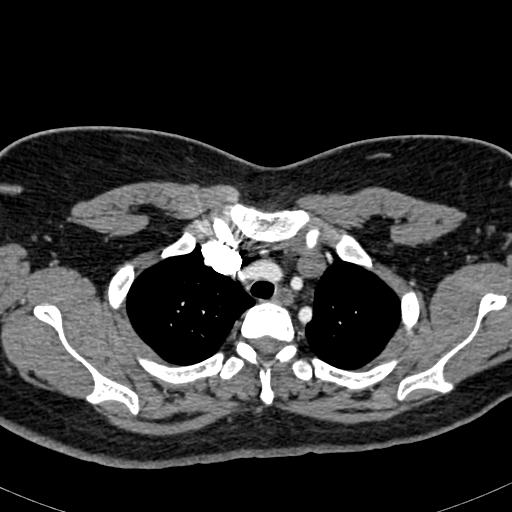
[im 231/262  lung]
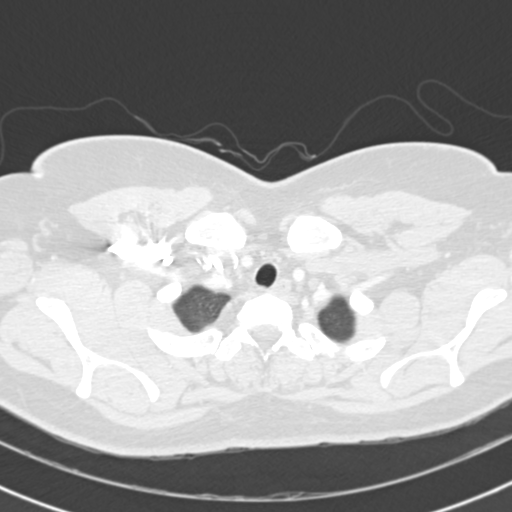
[im 246/262  mediastinal]
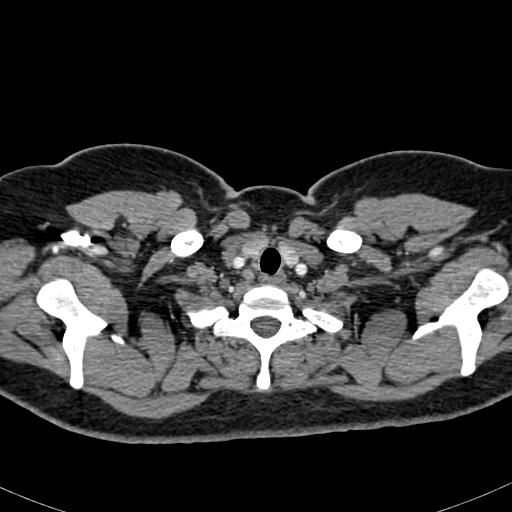

[19 of 36 positions shown; findings below may reference images not displayed]

FINDINGS: No filling defects in the pulmonary arteries to suggest pulmonary
emboli. No pleural or pericardial effusion. No mediastinal, hilar, or axillary
knee. No focal opacities in the lungs. Heart is borderline in size.

Imaging into the upper abdomen is unremarkable. Visualized chest wall soft
tissues are unremarkable. There are small low density areas within the thyroid
bilaterally, stable since prior study.
IMPRESSION: No evidence of pulmonary embolus or acute finding.

Heart upper limits of normal in size.

Small bilateral thyroid nodules. These are stable since prior CT. These can be
followed with ultrasound.

## 2009-04-07 ENCOUNTER — Ambulatory Visit: Payer: Self-pay | Admitting: Oncology

## 2009-04-10 LAB — CBC WITH DIFFERENTIAL (CANCER CENTER ONLY)
EOS%: 3.1 % (ref 0.0–7.0)
Eosinophils Absolute: 0.2 10*3/uL (ref 0.0–0.5)
LYMPH%: 36.3 % (ref 14.0–48.0)
MCH: 30.3 pg (ref 26.0–34.0)
MCHC: 33 g/dL (ref 32.0–36.0)
MCV: 92 fL (ref 81–101)
MONO%: 7 % (ref 0.0–13.0)
NEUT#: 2.9 10*3/uL (ref 1.5–6.5)
Platelets: 247 10*3/uL (ref 145–400)

## 2009-11-06 ENCOUNTER — Ambulatory Visit: Payer: Self-pay | Admitting: Oncology

## 2009-11-12 LAB — CBC WITH DIFFERENTIAL (CANCER CENTER ONLY)
Eosinophils Absolute: 0.4 10*3/uL (ref 0.0–0.5)
LYMPH%: 25.5 % (ref 14.0–48.0)
MCH: 31 pg (ref 26.0–34.0)
MCHC: 34.5 g/dL (ref 32.0–36.0)
MCV: 90 fL (ref 81–101)
MONO%: 7.1 % (ref 0.0–13.0)
Platelets: 320 10*3/uL (ref 145–400)
RBC: 4.08 10*6/uL (ref 3.70–5.32)

## 2010-01-07 ENCOUNTER — Ambulatory Visit: Payer: Self-pay | Admitting: Oncology

## 2010-01-13 LAB — CBC WITH DIFFERENTIAL (CANCER CENTER ONLY)
BASO%: 0.7 % (ref 0.0–2.0)
EOS%: 2.6 % (ref 0.0–7.0)
HCT: 35.4 % (ref 34.8–46.6)
LYMPH%: 20 % (ref 14.0–48.0)
MCH: 30.2 pg (ref 26.0–34.0)
MCHC: 34.5 g/dL (ref 32.0–36.0)
MCV: 88 fL (ref 81–101)
MONO#: 0.7 10*3/uL (ref 0.1–0.9)
MONO%: 6.9 % (ref 0.0–13.0)
NEUT%: 69.8 % (ref 39.6–80.0)
RDW: 12.5 % (ref 10.5–14.6)

## 2010-03-13 ENCOUNTER — Ambulatory Visit (HOSPITAL_BASED_OUTPATIENT_CLINIC_OR_DEPARTMENT_OTHER): Payer: 59 | Admitting: Oncology

## 2010-03-17 LAB — CBC WITH DIFFERENTIAL/PLATELET
BASO%: 0 % (ref 0.0–2.0)
LYMPH%: 13.8 % — ABNORMAL LOW (ref 14.0–49.7)
MCHC: 34.9 g/dL (ref 31.5–36.0)
MCV: 88.1 fL (ref 79.5–101.0)
MONO#: 0.7 10*3/uL (ref 0.1–0.9)
MONO%: 5.3 % (ref 0.0–14.0)
Platelets: 235 10*3/uL (ref 145–400)
RBC: 3.86 10*6/uL (ref 3.70–5.45)
WBC: 12.3 10*3/uL — ABNORMAL HIGH (ref 3.9–10.3)

## 2010-03-17 LAB — HEPARIN ANTI-XA: Heparin LMW: 0.64 IU/mL

## 2010-04-26 HISTORY — PX: DILATION AND CURETTAGE OF UTERUS: SHX78

## 2010-05-17 ENCOUNTER — Encounter: Payer: Self-pay | Admitting: Gynecology

## 2010-05-28 ENCOUNTER — Encounter (HOSPITAL_BASED_OUTPATIENT_CLINIC_OR_DEPARTMENT_OTHER): Payer: 59

## 2010-05-28 DIAGNOSIS — D6859 Other primary thrombophilia: Secondary | ICD-10-CM

## 2010-05-28 DIAGNOSIS — E282 Polycystic ovarian syndrome: Secondary | ICD-10-CM

## 2010-05-28 DIAGNOSIS — Z7901 Long term (current) use of anticoagulants: Secondary | ICD-10-CM

## 2010-05-28 LAB — CBC WITH DIFFERENTIAL/PLATELET
BASO%: 0.3 % (ref 0.0–2.0)
Basophils Absolute: 0 10*3/uL (ref 0.0–0.1)
EOS%: 0.8 % (ref 0.0–7.0)
Eosinophils Absolute: 0.1 10*3/uL (ref 0.0–0.5)
HCT: 33.6 % — ABNORMAL LOW (ref 34.8–46.6)
HGB: 11.7 g/dL (ref 11.6–15.9)
LYMPH%: 12.6 % — ABNORMAL LOW (ref 14.0–49.7)
MCH: 30.9 pg (ref 25.1–34.0)
MCHC: 34.8 g/dL (ref 31.5–36.0)
MCV: 88.9 fL (ref 79.5–101.0)
MONO#: 0.8 10*3/uL (ref 0.1–0.9)
MONO%: 6.3 % (ref 0.0–14.0)
NEUT#: 9.9 10*3/uL — ABNORMAL HIGH (ref 1.5–6.5)
NEUT%: 80 % — ABNORMAL HIGH (ref 38.4–76.8)
Platelets: 208 10*3/uL (ref 145–400)
RBC: 3.78 10*6/uL (ref 3.70–5.45)
RDW: 15.1 % — ABNORMAL HIGH (ref 11.2–14.5)
WBC: 12.4 10*3/uL — ABNORMAL HIGH (ref 3.9–10.3)
lymph#: 1.6 10*3/uL (ref 0.9–3.3)

## 2010-06-10 ENCOUNTER — Encounter: Payer: 59 | Attending: Obstetrics and Gynecology

## 2010-06-10 DIAGNOSIS — Z713 Dietary counseling and surveillance: Secondary | ICD-10-CM | POA: Insufficient documentation

## 2010-06-10 DIAGNOSIS — O99810 Abnormal glucose complicating pregnancy: Secondary | ICD-10-CM | POA: Insufficient documentation

## 2010-06-15 ENCOUNTER — Other Ambulatory Visit: Payer: Self-pay | Admitting: Oncology

## 2010-06-15 ENCOUNTER — Encounter (HOSPITAL_BASED_OUTPATIENT_CLINIC_OR_DEPARTMENT_OTHER): Payer: 59 | Admitting: Oncology

## 2010-06-15 DIAGNOSIS — E282 Polycystic ovarian syndrome: Secondary | ICD-10-CM

## 2010-06-15 DIAGNOSIS — Z7901 Long term (current) use of anticoagulants: Secondary | ICD-10-CM

## 2010-06-15 DIAGNOSIS — D6859 Other primary thrombophilia: Secondary | ICD-10-CM

## 2010-06-15 LAB — CBC WITH DIFFERENTIAL/PLATELET
Eosinophils Absolute: 0.1 10*3/uL (ref 0.0–0.5)
HCT: 36.9 % (ref 34.8–46.6)
LYMPH%: 14.9 % (ref 14.0–49.7)
MCV: 87.4 fL (ref 79.5–101.0)
MONO#: 0.5 10*3/uL (ref 0.1–0.9)
MONO%: 4.9 % (ref 0.0–14.0)
NEUT#: 8.3 10*3/uL — ABNORMAL HIGH (ref 1.5–6.5)
NEUT%: 79.3 % — ABNORMAL HIGH (ref 38.4–76.8)
Platelets: 221 10*3/uL (ref 145–400)
WBC: 10.4 10*3/uL — ABNORMAL HIGH (ref 3.9–10.3)

## 2010-06-29 ENCOUNTER — Encounter (HOSPITAL_BASED_OUTPATIENT_CLINIC_OR_DEPARTMENT_OTHER): Payer: 59 | Admitting: Oncology

## 2010-06-29 ENCOUNTER — Other Ambulatory Visit: Payer: Self-pay | Admitting: Oncology

## 2010-06-29 DIAGNOSIS — Z7901 Long term (current) use of anticoagulants: Secondary | ICD-10-CM

## 2010-06-29 DIAGNOSIS — E282 Polycystic ovarian syndrome: Secondary | ICD-10-CM

## 2010-06-29 DIAGNOSIS — D6859 Other primary thrombophilia: Secondary | ICD-10-CM

## 2010-06-29 LAB — CBC WITH DIFFERENTIAL/PLATELET
BASO%: 0.1 % (ref 0.0–2.0)
EOS%: 1 % (ref 0.0–7.0)
MCH: 30.5 pg (ref 25.1–34.0)
MCHC: 35.1 g/dL (ref 31.5–36.0)
RBC: 4.4 10*6/uL (ref 3.70–5.45)
RDW: 15.4 % — ABNORMAL HIGH (ref 11.2–14.5)
lymph#: 1.4 10*3/uL (ref 0.9–3.3)

## 2010-07-06 ENCOUNTER — Inpatient Hospital Stay (HOSPITAL_COMMUNITY): Admission: AD | Admit: 2010-07-06 | Payer: Self-pay | Admitting: Obstetrics and Gynecology

## 2010-07-06 ENCOUNTER — Other Ambulatory Visit: Payer: Self-pay | Admitting: Obstetrics and Gynecology

## 2010-07-06 ENCOUNTER — Inpatient Hospital Stay (HOSPITAL_COMMUNITY)
Admission: AD | Admit: 2010-07-06 | Discharge: 2010-07-09 | DRG: 765 | Disposition: A | Discharge status: Home / Self Care | Payer: 59 | Source: Ambulatory Visit | Attending: Obstetrics and Gynecology | Admitting: Obstetrics and Gynecology

## 2010-07-06 DIAGNOSIS — D689 Coagulation defect, unspecified: Secondary | ICD-10-CM | POA: Diagnosis present

## 2010-07-06 DIAGNOSIS — O324XX0 Maternal care for high head at term, not applicable or unspecified: Secondary | ICD-10-CM | POA: Diagnosis present

## 2010-07-06 DIAGNOSIS — D6859 Other primary thrombophilia: Secondary | ICD-10-CM | POA: Diagnosis present

## 2010-07-06 DIAGNOSIS — O99814 Abnormal glucose complicating childbirth: Secondary | ICD-10-CM | POA: Diagnosis present

## 2010-07-06 LAB — GLUCOSE, CAPILLARY
Glucose-Capillary: 95 mg/dL (ref 70–99)
Glucose-Capillary: 86 mg/dL (ref 70–99)
Glucose-Capillary: 84 mg/dL (ref 70–99)

## 2010-07-06 LAB — CBC
HCT: 40.4 % (ref 36.0–46.0)
Hemoglobin: 13.6 g/dL (ref 12.0–15.0)
MCH: 29.5 pg (ref 26.0–34.0)
MCHC: 33.7 g/dL (ref 30.0–36.0)
MCV: 87.6 fL (ref 78.0–100.0)

## 2010-07-07 LAB — CBC
MCH: 29 pg (ref 26.0–34.0)
MCV: 89.6 fL (ref 78.0–100.0)
Platelets: 155 10*3/uL (ref 150–400)
RBC: 3.66 MIL/uL — ABNORMAL LOW (ref 3.87–5.11)
RDW: 15.2 % (ref 11.5–15.5)

## 2010-07-09 LAB — PROTIME-INR: INR: 1.06 (ref 0.00–1.49)

## 2010-07-09 NOTE — Op Note (Signed)
Elizabeth Barnes, Elizabeth Barnes                ACCOUNT NO.:  [id number]  MEDICAL RECORD NO.:  [id number]           PATIENT TYPE:  I  LOCATION:  9148                          [facility]ILITY[facility]  [facility]  PHYSICIAN:  Maxie Better, M.D.DATE OF BIRTH:  03-24-1981  DATE OF PROCEDURE:  07/06/2010 DATE OF DISCHARGE:                              OPERATIVE REPORT   PREOPERATIVE DIAGNOSES: 1. Arrest of descent. 2. Class A1 gestational diabetes. 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 6. Term gestation.  PROCEDURES: 1. Primary cesarean section, Kerr hysterotomy.  POSTOPERATIVE DIAGNOSES: 1. Arrest of descent. 2. Class A1 gestational diabetes. 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 6. Term gestation. 7. Left occiput posterior presentation.  ANESTHESIA:  Epidural.  SURGEON:  Maxie Better, MD  ASSISTANT:  Arlan Organ, MD  DESCRIPTION OF PROCEDURE:  Under adequate epidural anesthesia, the patient was placed in the supine position with a left lateral tilt.  She was sterilely prepped and draped in usual fashion and indwelling Foley catheter was already in place which was draining blood-tinged urine.  A 0.25% Marcaine was injected along the planned Pfannenstiel skin incision site.  Pfannenstiel skin incision was then made, carried down to the rectus fascia.  The rectus fascia was then opened transversely.  The rectus fascia was then bluntly and sharply dissected off the rectus muscle in the superior and inferior fashion.  The rectus muscles split in midline.  The parietal peritoneum was entered bluntly and extended. The vesicouterine peritoneum was opened transversely.  The bladder was then bluntly dissected off the lower uterine segment and displaced with a bladder retractor.  A curvilinear low transverse uterine incision was then made and extended with bandage scissors.  Subsequent delivery of a live female from  the left occiput posterior presentation was accomplished.  The baby was bulb suctioned on the abdomen.  Cord was clamped and cut.  The baby was transferred to the awaiting pediatrician who assigned Apgars of 9 and 9 at 1 and 5 minutes.  The placenta was manually removed.  Uterine cavity was cleaned of debris.  Uterine incision had no extension.  Uterine incision was closed with 2 layers, the first layer was 0 Monocryl running locked stitch.  The second layer was closed with 0 Monocryl imbricating suture.  Good hemostasis was noted along the incision line.  Normal tubes were noted bilaterally. Both ovaries appeared polycystic, otherwise normal.  Abdomen was copiously irrigated, suctioned of debris.  The parietal peritoneum was closed with 2-0 Vicryl.  Rectus fascia was then closed with 0 Vicryl x2. The subcutaneous area was irrigated, small bleeders cauterized. Interrupted 2-0 plain suture was placed and the skin approximated with Ethicon staples.  SPECIMENS:  Placenta sent to Pathology, meconium-stained fluid.  ESTIMATED BLOOD LOSS:  800 mL.  INTRAOPERATIVE FLUID:  2500 mL.  URINE OUTPUT:  300 mL of urine which was clearing.  Sponge and instrument counts x2 was correct.  COMPLICATIONS:  None.  Weight of the baby was 8 pounds and 7 ounces.  The patient tolerated the procedure well and was transferred to recovery room  in stable condition.     Maxie Better, M.D.     [place]/MEDQ  D:  07/06/2010  T:  07/07/2010  Job:  [id number]  Electronically Signed by Nena Jordan Ollis Daudelin M.D. on 07/09/2010 05:43:49 AM

## 2010-07-10 ENCOUNTER — Other Ambulatory Visit: Payer: Self-pay | Admitting: Oncology

## 2010-07-10 ENCOUNTER — Encounter (HOSPITAL_BASED_OUTPATIENT_CLINIC_OR_DEPARTMENT_OTHER): Payer: 59 | Admitting: Oncology

## 2010-07-10 DIAGNOSIS — E282 Polycystic ovarian syndrome: Secondary | ICD-10-CM

## 2010-07-10 DIAGNOSIS — Z5181 Encounter for therapeutic drug level monitoring: Secondary | ICD-10-CM

## 2010-07-10 DIAGNOSIS — D6859 Other primary thrombophilia: Secondary | ICD-10-CM

## 2010-07-10 DIAGNOSIS — Z7901 Long term (current) use of anticoagulants: Secondary | ICD-10-CM

## 2010-07-10 LAB — CBC WITH DIFFERENTIAL/PLATELET
Basophils Absolute: 0 10*3/uL (ref 0.0–0.1)
EOS%: 3.5 % (ref 0.0–7.0)
HCT: 32.2 % — ABNORMAL LOW (ref 34.8–46.6)
HGB: 10.9 g/dL — ABNORMAL LOW (ref 11.6–15.9)
MCH: 30 pg (ref 25.1–34.0)
MONO#: 0.6 10*3/uL (ref 0.1–0.9)
NEUT%: 75.1 % (ref 38.4–76.8)
lymph#: 1.6 10*3/uL (ref 0.9–3.3)

## 2010-07-10 LAB — HEPARIN LEVEL (UNFRACTIONATED): Heparin Unfractionated: 0.8 IU/mL — ABNORMAL HIGH (ref 0.3–0.7)

## 2010-07-10 LAB — PROTIME-INR

## 2010-07-17 ENCOUNTER — Other Ambulatory Visit: Payer: Self-pay | Admitting: Oncology

## 2010-07-17 ENCOUNTER — Encounter (HOSPITAL_BASED_OUTPATIENT_CLINIC_OR_DEPARTMENT_OTHER): Payer: 59 | Admitting: Oncology

## 2010-07-17 DIAGNOSIS — E282 Polycystic ovarian syndrome: Secondary | ICD-10-CM

## 2010-07-17 DIAGNOSIS — D6859 Other primary thrombophilia: Secondary | ICD-10-CM

## 2010-07-17 LAB — PROTIME-INR
INR: 2.4 (ref 2.00–3.50)
Protime: 28.8 Seconds — ABNORMAL HIGH (ref 10.6–13.4)

## 2010-07-24 ENCOUNTER — Encounter (HOSPITAL_BASED_OUTPATIENT_CLINIC_OR_DEPARTMENT_OTHER): Payer: 59 | Admitting: Oncology

## 2010-07-24 ENCOUNTER — Other Ambulatory Visit: Payer: Self-pay | Admitting: Oncology

## 2010-07-24 DIAGNOSIS — D6859 Other primary thrombophilia: Secondary | ICD-10-CM

## 2010-07-24 DIAGNOSIS — E282 Polycystic ovarian syndrome: Secondary | ICD-10-CM

## 2010-08-01 NOTE — Discharge Summary (Signed)
Elizabeth Barnes, Elizabeth Barnes                ACCOUNT NO.:  [id number]  MEDICAL RECORD NO.:  [id number]           PATIENT TYPE:  I  LOCATION:  9148                          [facility]  PHYSICIAN:  IT trainer, M.D.DATE OF BIRTH:  Apr 14, 1981  DATE OF ADMISSION:  07/06/2010 DATE OF DISCHARGE:  07/09/2010                              DISCHARGE SUMMARY   ADMISSION DIAGNOSES:  Intrauterine pregnancy at 39 weeks in labor, anticoagulation therapy for known protein C and protein S deficiency and prothrombin II deficiency, currently on heparin, gestational diabetic type A1.  DISCHARGE DIAGNOSES:  Status post primary cesarean section for failure to descent, postop day #3, stable on anticoagulation therapy.  PATIENT HISTORY:  The patient is a gravida 1, para 0 at 80 weeks' gestation with [facility] July 13, 2010.  Prenatal care obtained at [facility] since 9 weeks with primary Dr. Cherly Hensen.  PRENATAL LABS:  B+, rubella equivocal, GBS negative, HIV negative, RPR nonreactive, hepatitis B negative, and abnormal 3-hour GTT.  PRENATAL COURSE:  Complicated by coagulation defect, protein C and protein S and prothrombin II deficient, history of bilateral pulmonary embolus, IVF conception, obesity, and GDM A1.  MEDICAL/SURGICAL HISTORY:  History of obesity, PCOS, infertility, and coagulation defect.  ALLERGIES:  No known drug allergies.  CURRENT MEDICATIONS:  Heparin, prenatal vitamins, nystatin, triamcinolone, itraconazole, MiraLax, and vitamin D3.  PRESENTATION:  The patient presents to [facility] on July 06, 2010, early morning with active labor, contractions, reassuring heart tones.  Cervix is 4 to 5 cm, completely effaced, and -2 station with intact membranes.  ADMISSION LABS:  WBCs 11.4, hemoglobin 13.6, hematocrit 40.4, platelet 182, RPR nonreactive.  PT 13.5, INR 1.01, PTT 26, blood glucose 95.  INTRAPARTUM COURSE SUMMARY:  The patient is admitted to birthing suite in active labor and we proceed with  active management of labor.  An IUPC was placed, amnio infusion for variable decelerations and the patient was AROM for moderate meconium.  Low-dose Pitocin was started and the patient received an epidural for pain control.  By 5:50 p.m., the patient was completely dilated, was allowed to labor down and intermittently pushing.  At 9:10, there was no progress in fetal vertex station and the patient was counseled for C-section.  SURGERY:  The patient was delivered via primary C-section on July 06, 2010, by Dr. Cherly Hensen.  Indication was failure to descend. The patient was delivered of a female infant, the weight of 8 pounds 7 ounces and Apgars 9 and 9.  Newborn was transferred to regular nursery.  POST-OP COURSE: Postop course was uneventful.  The patient was restarted on Lovenox injection postpartum and was started on Coumadin as well at 24 hours postop.  POSTOPERATIVE LABS:  WBC 17.0, hemoglobin 10.6, hematocrit 32.8, platelets 155.  PT 13.5, INR 1.01.  Repeated on postop day 3, PT was 14.0 and INR 1.06.  The patient was deemed to be not in therapeutic values for INR.  Dr. Welton Flakes, hematologist, consulted prior to discharge, recommends maintain current doses of Lovenox and Coumadin until she sees the patient on postpartum postop day #4, which is tomorrow.  Vital signs were stable throughout  hospital stay.  The patient remained afebrile. Physical exam was within normal limits.  Wound well approximated with staples.  No erythema and no ecchymosis and no drainage.  Newborn was breastfed.  The patient was discharged to home in stable condition on postop day #3, staples were removed and replaced with Steri-Strips, discharged home on a regular diet.  Activity ad lib with postop lifting restrictions x2 weeks and instructions per [facility] booklet.  DISCHARGE MEDICATIONS:  The patient was given MMR vaccine prior to discharge, prenatal vitamins 1 tablet p.o. daily, Tylox 1 to 2 tablets p.o. q.4-6 h.  p.r.n. for pain, Lovenox 120 mg subcu daily, and Coumadin 5 mg p.o. daily.  The patient has a followup appointment with Dr. Welton Flakes in 1 day and postpartum visit at 6 weeks with [facility].    ______________________________ Arlan Organ, CNM   ______________________________ Maxie Better, M.D.    DP/MEDQ  D:  07/09/2010  T:  07/10/2010  Job:  [id number]  Electronically Signed by Arlan Organ CNM on 07/31/2010 11:09:00 AM Electronically Signed by Nena Jordan Leticia Coletta M.D. on 08/01/2010 05:25:02 PM

## 2010-08-25 ENCOUNTER — Other Ambulatory Visit: Payer: Self-pay | Admitting: Obstetrics and Gynecology

## 2010-09-08 NOTE — Group Therapy Note (Signed)
NAME:  Elizabeth Barnes, Elizabeth Barnes            ACCOUNT NO.:  [id number]   MEDICAL RECORD NO.:  [id number]          PATIENT TYPE:  WOC   LOCATION:  [facility]                   FACILITY:  [facility]   PHYSICIAN:  Argentina Donovan, MD        DATE OF BIRTH:  16-Dec-1980   DATE OF SERVICE:  09/08/2006                                  CLINIC NOTE   The patient is a 30 year old Caucasian female nulligravida with a  polycystic ovarian syndrome diagnosed by the fact that she has like 3 to  4 periods in a month and hirsutism, plus an ultrasound done at the  family practice that showed polycystic ovaries.  She is on Coumadin  chronically for a history of pulmonary embolus x2 and DVTs.  They are  keeping her on that they said. She was on it from November 2005 until  June 2006.  She stopped it and had a pulmonary embolism in January 2007  and was placed back on it and is being kept on it. She desires  pregnancy. Has seen them up at the [facility] and has been sent over for  counseling.  We are starting her primarily on Glucophage 500 t.i.d. with  meals. Going to have her come back in about 2 months.  She is going to  start on a temperature chart.  She is going to withdraw before she  starts the Glucophage with Provera, which has been given to her  by her  family practice doctor and we are going to evaluated see whether or not  she would be a good candidate or needs clomiphene citrate.   IMPRESSION:  Polycystic ovarian syndrome.   PLAN:  Is eventual pregnancy.           ______________________________  Argentina Donovan, MD     PR/MEDQ  D:  09/08/2006  T:  09/08/2006  Job:  [id number]

## 2010-09-11 NOTE — Discharge Summary (Signed)
NAME:  Elizabeth Barnes, Elizabeth Barnes                      ACCOUNT NO.:  [id number]   MEDICAL RECORD NO.:  [id number]                   PATIENT TYPE:  INP   LOCATION:  4705                                 FACILITY:  [facility]   PHYSICIAN:  Kamia L. Jolinda Croak, M.D.          DATE OF BIRTH:  12-21-1980   DATE OF ADMISSION:  03/16/2003  DATE OF DISCHARGE:                                 DISCHARGE SUMMARY   ATTENDING:  Lurena Joiner L. Jolinda Croak, M.D.   INTERN:  Ursula Beath, M.D.   DISCHARGE DIAGNOSES:  1. Bilateral pulmonary emboli.  2. Pneumonia.   DISCHARGE MEDICATIONS:  1. Coumadin 5 mg tablets to be taken p.o. as directed by home health nurse.  2. Azithromycin 250 mg two tablets p.o. daily for four days.  3. Lovenox 95 mg subcu q.12h.  4. Percocet one tablet p.o. q.4-6h. p.r.n. pain.   ACTIVITY:  She may resume all of her normal activity.   SPECIAL INSTRUCTIONS:  She is instructed to return to the emergency  department if she develops shortness of breath.  [facility] will be  checking her INRs daily and will call the on-call resident for family  practice at [facility] for dosing of Coumadin.   DISPOSITION:  Discharged to home.   FOLLOW-UP:  Wednesday, April 03, 2003 at 2:30 p.m. with Dr. Ursula Beath at [facility].   PROCEDURES PERFORMED:  Chest CT which showed bilateral acute pulmonary  emboli and mild lingular atelectasis; a CT of lower extremities showing  minimal residual of DVT in the right superficial femoral vein; no pelvic  vein DVT was demonstrated; and a chest x-ray showing left lower lobe  atelectasis versus infiltrate with a small left pleural effusion.   BRIEF HISTORY AND PHYSICAL:  This is a 30 year old female who presented to  the ED on the day of admission who woke up that morning with left-sided  chest pain described as sharp and 9/10.  This pain was worsened by deep  breathing and movement.  It improves when she lies on her left  side.  Approximately a week-and-a-half prior to admission she twisted her ankle.  She was seen at [facility] and was told she had torn her  Achilles tendon.  She was put in an immobilization boot.  This did not help  her pain and the Wednesday prior to admission she went to see [facility].  She was told she did not have an Achilles tendon rupture but  did possibly have a blood clot and she was sent to [facility] for a Doppler  on that Thursday prior to admission.  She was told that she had a blood clot  at that time and was told to take aspirin only.  She was also given ankle  exercises to perform.  The day of admission she developed the aforementioned  pain but no shortness of breath.  Please see the dictated H&P  for full  history and physical.  Upon admission her CBC showed white count of 11.9,  H&H of 12.9 and 36.7, platelets of 262, with a slight left shift of  neutrophils 87%.  A PT and INR at that time were 13.4 and 1.0.  Chem-7 was  within normal limits and liver function tests were within normal limits with  the exception of albumin which was slightly low at 3.4, iron was 32, and  ferritin was 97.  Please note that although the computer reads that she had  a positive occult blood test this was improperly entered into the computer  and it was noted to be negative by the resident who was on call and admitted  the patient.   HOSPITAL COURSE:  #1 - BILATERAL PULMONARY EMBOLI.  The patient never had  shortness of breath and was started on Lovenox and Coumadin therapy without  issue.  Her INR had increased to 1.4 the date of discharge.  She appears to  be stable and doing well.  She will be discharged to home under the care of  [facility].  Their orders are for [facility]  Nursing to call the on-call resident for family practice at [facility]  for INR updates and to re-dose her Coumadin as needed.   #2 - PNEUMONIA.  The  patient was febrile during the initial part of her  admission.  A chest x-ray was performed which appeared to be a possible  pneumonia.  Her UA was negative and she was treated for her community-  acquired pneumonia with azithromycin.   The patient was discharged to home in improved condition on March 20, 2003.      Ursula Beath, MD                     Randon Goldsmith. Jolinda Croak, M.D.    JT/MEDQ  D:  03/20/2003  T:  03/20/2003  Job:  [id number]

## 2010-09-11 NOTE — Discharge Summary (Signed)
NAMEJAMILLE, Elizabeth Barnes            ACCOUNT NO.:  [id number]   MEDICAL RECORD NO.:  [id number]          PATIENT TYPE:  INP   LOCATION:  4739                         FACILITY:  [facility]   PHYSICIAN:  Leighton Roach McDiarmid, M.D.DATE OF BIRTH:  11-20-1980   DATE OF ADMISSION:  05/15/2004  DATE OF DISCHARGE:  05/16/2004                                 DISCHARGE SUMMARY   DISCHARGE DIAGNOSIS:  Recurrent pulmonary embolus.   DISCHARGE MEDICATIONS:  1.  Lovenox 100 mg subcutaneous q.12h.  2.  Coumadin 7.5 mg one pill daily.   FOLLOWUP ITEMS:  1.  Please call Dr. Raquel James for a followup appointment in one to two weeks      at [facility].  2.  Please go to the family medicine office on May 19, 2004, for PT and      INR laboratory draws.   HOSPITAL COURSE:  This is a 30 year old white female who presented to the  [facility] Emergency Room with an onset of left-sided chest pain.  The  patient has a history of prior PE.  CT of the chest was obtained which  revealed pulmonary embolus of the segmental vessel.  Subsequently the  patient was started on Lovenox and low-molecular weight heparin.  It was  decided that the patient would be able to go home, assuming that her  insurance would pay for Lovenox, which it would.  So she was discharged with  Lovenox and Coumadin dosed as mentioned above.   LABORATORY DATA:  Discharge laboratories revealed a heparin level of 0.68,  PT 13.6 and INR 1.1.  BMP with sodium 135, potassium 3.8, chloride 107, CO2  25, glucose 98, BUN 12, creatinine 0.9 and calcium 8.6.  White count 7.6,  hemoglobin 13.0, hematocrit 37.2, platelet count 305.  D-dimer at the time  of admission was 1.37.  Lower extremity Dopplers revealed no DVT and no  Baker's cyst.      TH/MEDQ  D:  05/26/2004  T:  05/26/2004  Job:  [id number]   cc:   Ursula Beath, MD  Fax: [phone]

## 2010-09-11 NOTE — H&P (Signed)
NAME:  Elizabeth Barnes, Elizabeth Barnes                      ACCOUNT NO.:  [id number]   MEDICAL RECORD NO.:  [id number]                   PATIENT TYPE:  INP   LOCATION:  4705                                 FACILITY:  [facility]   PHYSICIAN:  Lindaann L. Jolinda Croak, M.D.          DATE OF BIRTH:  23-Aug-1980   DATE OF ADMISSION:  03/16/2003  DATE OF DISCHARGE:                                HISTORY & PHYSICAL   CHIEF COMPLAINT:  Chest pain.   HISTORY OF PRESENT ILLNESS:  Patient is a 30 year old previously healthy  female who upon awakening this morning had left-sided chest pain described  as sharp, 9/10, and worsened by deep breathing and movement.  It is improved  by lying on her right side.  Last Tuesday, approximately a week and a half  ago, she twisted her ankle.  Three days later, on Friday, she was seen at  [facility] and was told she had torn her Achilles tendon.  She was put  in a boot, but this did not help her pain.  On Wednesday of the week prior  to admission, she went to [facility] and was told she may have a  blood clot and was sent to [facility] for a Doppler on Thursday.  She was  told she had a blood clot in a not so important vein, notably the  superficial femoral vein, and was told to take aspirin.  She was given ankle  exercises to perform.  She also determined that she did not have an Achilles  tendon tear at that time.   PAST MEDICAL HISTORY:  Not significant.  Her previous primary medical doctor  is Abner Greenspan at [facility].  She will begin seeing Dr.  Andrey Campanile on [facility] when she can get her records forwarded.   PAST SURGICAL HISTORY:  She had an appendectomy at age 78 and then a  tonsillectomy at age 10.   MEDICATIONS:  1. Aspirin.  2. Oral contraceptives, which she stopped on Thursday the week prior to     admission.   ALLERGIES:  No known drug allergies.   REVIEW OF SYSTEMS:  Positive for a cough x2 days and dizziness.  She also  has  had some right leg tingling when it is elevated.  Review of systems is  otherwise negative.   FAMILY HISTORY:  Paternal grandfather with a DVT after a hip fracture.  A  paternal aunt with hemochromatosis.  An uncle with asthma.  A maternal  grandmother with an aortic valve disease.  A great-grandfather who had an MI  at approximately 29 years old.  She has multiple family members with  diabetes mellitus Type II.   SOCIAL HISTORY:  She lives in [place] with her sister.  She works at  [employer] in [employer].  She is also in school at  [employer].  She denies alcohol, tobacco, or drug use.   PHYSICAL EXAMINATION:  VITAL SIGNS:  Temp 98.4, heart rate 97, dropping to  93, respiratory rate 20, dropping to 16, blood pressure 124/90, dropping to  106/67.  She is satting at 99% on room air.  GENERAL:  She is in no acute distress.  Alert and oriented.  CARDIOVASCULAR:  She has a regular rate and rhythm.  No murmurs, rubs or  gallops are noted.  She has +2 distal pulses.  LUNGS:  Clear to auscultation bilaterally.  HEENT:  Pupils are equal, round and reactive to light.  She is normocephalic  and atraumatic.  Extraocular motions are intact.  TMs are clear with a good  light reflex.  Oropharynx is without erythema or exudate.  No  lymphadenopathy.  NECK:  Supple.  ABDOMEN:  Soft, nontender, nondistended with normoactive bowel sounds.  No  hepatosplenomegaly.  NEURO:  Cranial nerves II-XII are intact.  Strength is 5/5 throughout.  DTRs  are +2 in bilateral biceps and patellar tendons.  Her sensation is intact  bilaterally.  EXTREMITIES:  There is no edema.  No clubbing or cyanosis.  No palpable  cords.  Negative Homans' sign.  RECTAL:  A small amount of blood in the vault.   LABS:  Her urine pregnancy screen is negative.  PT is 13.1, INR 1.0, PTT 29.  Hemoccult is pending.  CBC:  White count 14.4, H&H 13.2 and 37.7.  Platelets  243.  Neutrophils 87%, lymphocytes 8%,  monocytes 6%, eosinophils 0, baso 0.  BMET:  Sodium 135, potassium 3.5, chloride 106, bicarb 23, BUN 7, creatinine  0.7, glucose 88, calcium 8.9, total protein 6.7, albumin 3.4, AST 17, ALT  21, alk phos 63, total bilirubin 0.5.   CT of the chest shows bilateral acute pulmonary emboli and mild lingular  atelectasis.  A CT of the lower extremities shows possible minimal residual  DVT in the right superficial femoral vein.  No pelvic vein DVT.   EKG shows sinus tach, otherwise normal.   ASSESSMENT/PLAN:  This is a 30 year old female with a history of recent DVT  and acute bilateral pulmonary emboli.  Problem #1:  Deep venous thrombosis and pulmonary embolus:  We will start  Lovenox and Coumadin.  We will check a CBC and INR in the morning.  It seems  likely that her pulmonary embolus is secondary to a deep venous thrombosis  after immobilization of her right leg.  With her family history of  hemochromatosis, we will check iron studies.  Hemochromatosis was not  actually seen to be a risk factor for deep venous thrombosis and pulmonary  embolus, but her family is quite concerned about this, so we will check it  just to be absolutely certain.  We will continue to keep her from taking  oral contraceptive pills and advise her about not using estrogen in the  future.  She will be placed on bedrest and pain management with morphine,  __________, regular diet, and saline lock per IV.      Elizabeth Beath, MD                     Randon Goldsmith. Jolinda Croak, M.D.    JT/MEDQ  D:  03/16/2003  T:  03/17/2003  Job:  [id number]

## 2011-01-27 ENCOUNTER — Encounter (HOSPITAL_COMMUNITY): Payer: Self-pay

## 2011-01-27 ENCOUNTER — Encounter (HOSPITAL_COMMUNITY)
Admission: RE | Admit: 2011-01-27 | Discharge: 2011-01-27 | Disposition: A | Discharge status: Home / Self Care | Payer: 59 | Source: Ambulatory Visit | Attending: Obstetrics and Gynecology | Admitting: Obstetrics and Gynecology

## 2011-01-27 HISTORY — DX: Acute embolism and thrombosis of unspecified deep veins of unspecified lower extremity: I82.409

## 2011-01-27 HISTORY — DX: Other primary thrombophilia: D68.59

## 2011-01-27 HISTORY — DX: Other pulmonary embolism without acute cor pulmonale: I26.99

## 2011-01-27 LAB — CBC
Hemoglobin: 12.7 g/dL (ref 12.0–15.0)
MCH: 28.3 pg (ref 26.0–34.0)
MCV: 85.7 fL (ref 78.0–100.0)
RBC: 4.48 MIL/uL (ref 3.87–5.11)

## 2011-01-27 NOTE — Pre-Procedure Instructions (Addendum)
Dr Malen Gauze updated-pt has history of DVT with subsequent PE in 2005, was on coumadin therapy for approx 6 months then d/c in 1/06-pt had another PE in 09/2004. 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 Dr Cherly Hensen and Dr Burnetta Sabin pcc-will manage coumadin.

## 2011-01-27 NOTE — Patient Instructions (Addendum)
   Your procedure is scheduled on: Wednesday October 10th  Enter through the [employer] of [facility] at: 1pm Pick up the phone at the desk and dial [phone] and inform us of your arrival  Please call this number if you have any problems the morning of surgery: [phone]  Remember: Do not eat food after midnight  Do not drink clear liquids after: 10:30 am Take these medicines the morning of surgery with a SIP OF WATER: manage coumadin per Dr Cherly Hensen and Dr Elpidio Anis  Do not wear jewelry, make-up, or FINGER nail polish Do not wear lotions, powders, or perfumes.  Do not shave 48 hours prior to surgery. Do not bring valuables to the hospital. Patients discharged on the day of surgery will not be allowed to drive home.   Name and phone number of your driver:   Remember to use your hibiclens as instructed.Please shower with 1/2 bottle the evening before your surgery and the other 1/2 bottle the morning of surgery.

## 2011-01-28 ENCOUNTER — Other Ambulatory Visit: Payer: Self-pay | Admitting: Obstetrics and Gynecology

## 2011-02-01 LAB — CBC
Hemoglobin: 13.4
MCHC: 34.7
MCV: 88.5
RBC: 4.38

## 2011-02-01 LAB — DIFFERENTIAL
Basophils Relative: 1
Eosinophils Absolute: 0.2
Eosinophils Relative: 2
Monocytes Absolute: 0.7
Monocytes Relative: 8

## 2011-02-01 LAB — BASIC METABOLIC PANEL
BUN: 13
Chloride: 105
Creatinine, Ser: 0.59
Glucose, Bld: 77
Potassium: 3.8

## 2011-02-03 ENCOUNTER — Other Ambulatory Visit: Payer: Self-pay | Admitting: Obstetrics and Gynecology

## 2011-02-03 ENCOUNTER — Encounter (HOSPITAL_COMMUNITY): Payer: Self-pay | Admitting: Anesthesiology

## 2011-02-03 ENCOUNTER — Ambulatory Visit (HOSPITAL_COMMUNITY)
Admission: RE | Admit: 2011-02-03 | Discharge: 2011-02-03 | Disposition: A | Discharge status: Other Acute Inpatient Hospital | Payer: 59 | Source: Ambulatory Visit | Attending: Obstetrics and Gynecology | Admitting: Obstetrics and Gynecology

## 2011-02-03 ENCOUNTER — Ambulatory Visit (HOSPITAL_COMMUNITY): Payer: 59 | Admitting: Anesthesiology

## 2011-02-03 ENCOUNTER — Encounter (HOSPITAL_COMMUNITY)
Admission: RE | Disposition: A | Discharge status: Other Acute Inpatient Hospital | Payer: Self-pay | Source: Ambulatory Visit | Attending: Obstetrics and Gynecology

## 2011-02-03 DIAGNOSIS — N920 Excessive and frequent menstruation with regular cycle: Secondary | ICD-10-CM

## 2011-02-03 DIAGNOSIS — Z01812 Encounter for preprocedural laboratory examination: Secondary | ICD-10-CM | POA: Insufficient documentation

## 2011-02-03 DIAGNOSIS — N949 Unspecified condition associated with female genital organs and menstrual cycle: Secondary | ICD-10-CM | POA: Insufficient documentation

## 2011-02-03 DIAGNOSIS — N840 Polyp of corpus uteri: Secondary | ICD-10-CM | POA: Insufficient documentation

## 2011-02-03 DIAGNOSIS — Z01818 Encounter for other preprocedural examination: Secondary | ICD-10-CM | POA: Insufficient documentation

## 2011-02-03 DIAGNOSIS — N938 Other specified abnormal uterine and vaginal bleeding: Secondary | ICD-10-CM | POA: Insufficient documentation

## 2011-02-03 LAB — PROTIME-INR: INR: 1.1 (ref 0.00–1.49)

## 2011-02-03 SURGERY — DILATATION & CURETTAGE/HYSTEROSCOPY WITH RESECTOCOPE
Anesthesia: General | Site: Vagina | Wound class: Clean Contaminated

## 2011-02-03 MED ORDER — LIDOCAINE HCL (CARDIAC) 20 MG/ML IV SOLN
INTRAVENOUS | Status: AC
  Filled 2011-02-03: qty 5

## 2011-02-03 MED ORDER — KETOROLAC TROMETHAMINE 30 MG/ML IJ SOLN
INTRAMUSCULAR | Status: AC
  Filled 2011-02-03: qty 1

## 2011-02-03 MED ORDER — PANTOPRAZOLE SODIUM 40 MG PO TBEC: 40.0000 mg | DELAYED_RELEASE_TABLET | Freq: Every day | ORAL | Status: DC

## 2011-02-03 MED ORDER — MIDAZOLAM HCL 5 MG/5ML IJ SOLN
INTRAMUSCULAR | Status: DC | PRN
  Administered 2011-02-03: 2 mg via INTRAVENOUS

## 2011-02-03 MED ORDER — MIDAZOLAM HCL 2 MG/2ML IJ SOLN
INTRAMUSCULAR | Status: AC
  Filled 2011-02-03: qty 2

## 2011-02-03 MED ORDER — FENTANYL CITRATE 0.05 MG/ML IJ SOLN
INTRAMUSCULAR | Status: DC | PRN
  Administered 2011-02-03: 50 ug via INTRAVENOUS

## 2011-02-03 MED ORDER — PROPOFOL 10 MG/ML IV EMUL
INTRAVENOUS | Status: DC | PRN
  Administered 2011-02-03: 150 mg via INTRAVENOUS

## 2011-02-03 MED ORDER — FENTANYL CITRATE 0.05 MG/ML IJ SOLN
INTRAMUSCULAR | Status: AC
  Administered 2011-02-03: 25 ug via INTRAVENOUS
  Filled 2011-02-03: qty 2

## 2011-02-03 MED ORDER — IBUPROFEN 800 MG PO TABS: 800.0000 mg | ORAL_TABLET | Freq: Three times a day (TID) | ORAL | Status: DC | PRN

## 2011-02-03 MED ORDER — ONDANSETRON HCL 4 MG/2ML IJ SOLN
INTRAMUSCULAR | Status: AC
  Filled 2011-02-03: qty 2

## 2011-02-03 MED ORDER — ONDANSETRON HCL 4 MG/2ML IJ SOLN
INTRAMUSCULAR | Status: DC | PRN
  Administered 2011-02-03: 4 mg via INTRAVENOUS

## 2011-02-03 MED ORDER — LIDOCAINE HCL (CARDIAC) 20 MG/ML IV SOLN
INTRAVENOUS | Status: DC | PRN
  Administered 2011-02-03: 100 mg via INTRAVENOUS

## 2011-02-03 MED ORDER — GLYCINE 1.5 % IR SOLN
Status: DC | PRN
  Administered 2011-02-03 (×2): 3000 mL

## 2011-02-03 MED ORDER — KETOROLAC TROMETHAMINE 30 MG/ML IJ SOLN
INTRAMUSCULAR | Status: DC | PRN
  Administered 2011-02-03: 30 mg via INTRAVENOUS

## 2011-02-03 MED ORDER — FENTANYL CITRATE 0.05 MG/ML IJ SOLN
INTRAMUSCULAR | Status: AC
  Filled 2011-02-03: qty 2

## 2011-02-03 MED ORDER — LACTATED RINGERS IV SOLN
INTRAVENOUS | Status: DC
  Administered 2011-02-03: 13:00:00 via INTRAVENOUS
  Administered 2011-02-03: 125 mL/h via INTRAVENOUS
  Administered 2011-02-03: 15:00:00 via INTRAVENOUS

## 2011-02-03 MED ORDER — HYDROCODONE-ACETAMINOPHEN 5-500 MG PO TABS: 1.0000 | ORAL_TABLET | Freq: Four times a day (QID) | ORAL | Status: AC | PRN

## 2011-02-03 MED ORDER — CHLOROPROCAINE HCL 1 % IJ SOLN
INTRAMUSCULAR | Status: DC | PRN
  Administered 2011-02-03: 10 mL

## 2011-02-03 MED ORDER — FENTANYL CITRATE 0.05 MG/ML IJ SOLN
25.0000 ug | INTRAMUSCULAR | Status: DC | PRN
  Administered 2011-02-03: 25 ug via INTRAVENOUS

## 2011-02-03 MED ORDER — FENTANYL CITRATE 0.05 MG/ML IJ SOLN: 1.0000 ug/kg | INTRAMUSCULAR | Status: DC | PRN

## 2011-02-03 MED ORDER — PROPOFOL 10 MG/ML IV EMUL
INTRAVENOUS | Status: AC
  Filled 2011-02-03: qty 20

## 2011-02-03 SURGICAL SUPPLY — 17 items
CANISTER SUCTION 2500CC (MISCELLANEOUS) ×2 IMPLANT
CATH ROBINSON RED A/P 16FR (CATHETERS) ×2 IMPLANT
CLOTH BEACON ORANGE TIMEOUT ST (SAFETY) ×2 IMPLANT
CONTAINER PREFILL 10% NBF 60ML (FORM) ×4 IMPLANT
DRAPE UTILITY XL STRL (DRAPES) ×4 IMPLANT
ELECT REM PT RETURN 9FT ADLT (ELECTROSURGICAL) ×2
ELECTRODE REM PT RTRN 9FT ADLT (ELECTROSURGICAL) ×1 IMPLANT
ELECTRODE ROLLER VERSAPOINT (ELECTRODE) IMPLANT
ELECTRODE RT ANGLE VERSAPOINT (CUTTING LOOP) IMPLANT
GLOVE BIO SURGEON STRL SZ 6.5 (GLOVE) ×2 IMPLANT
GLOVE BIOGEL PI IND STRL 7.0 (GLOVE) ×2 IMPLANT
GLOVE BIOGEL PI INDICATOR 7.0 (GLOVE) ×2
GOWN PREVENTION PLUS LG XLONG (DISPOSABLE) ×4 IMPLANT
LOOP ANGLED CUTTING 22FR (CUTTING LOOP) IMPLANT
PACK HYSTEROSCOPY LF (CUSTOM PROCEDURE TRAY) ×2 IMPLANT
TOWEL OR 17X24 6PK STRL BLUE (TOWEL DISPOSABLE) ×4 IMPLANT
WATER STERILE IRR 1000ML POUR (IV SOLUTION) ×1 IMPLANT

## 2011-02-03 NOTE — Brief Op Note (Signed)
02/03/2011  4:04 PM  PATIENT:  Elizabeth Barnes  30 y.o. female  PRE-OPERATIVE DIAGNOSIS:  History of Bilateral Pulmonary Embolism, Endometrial Masses, Dysfunctional Uterine Bleeding  POST-OPERATIVE DIAGNOSIS:  History of Bilateral Pulmonary Dysfunctional Uterine Bleeding  PROCEDURE:  Procedure(s): DILATATION & CURETTAGE/HYSTEROSCOPY WITH RESECTOSCOPE  SURGEON:  Surgeon(s): Serita Kyle, MD  PHYSICIAN ASSISTANT:   ASSISTANTS: none   ANESTHESIA:   general and paracervical block  EBL:  Total I/O In: 1000 [I.V.:1000] Out: -   BLOOD ADMINISTERED:none  DRAINS: none   LOCAL MEDICATIONS USED:  OTHER nesicaine  SPECIMEN:  Source of Specimen:  endometrial currettings with endom polyps  DISPOSITION OF SPECIMEN:  PATHOLOGY  COUNTS:  YES  TOURNIQUET:  * No tourniquets in log *  DICTATION: .Other Dictation: Dictation Number   PLAN OF CARE: Discharge to home after PACU  PATIENT DISPOSITION:  PACU - hemodynamically stable.   Delay start of Pharmacological VTE agent (>24hrs) due to surgical blood loss or risk of bleeding:  no

## 2011-02-03 NOTE — Anesthesia Postprocedure Evaluation (Signed)
Anesthesia Post Note  Patient: Elizabeth Barnes  Procedure(s) Performed:  DILATATION & CURETTAGE/HYSTEROSCOPY WITH RESECTOSCOPE  Anesthesia type: General  Patient location: PACU  Post pain: Pain level controlled  Post assessment: Post-op Vital signs reviewed  Last Vitals:  Filed Vitals:   02/03/11 1645  BP: 115/72  Pulse: 76  Temp: 97.9 F (36.6 C)  Resp: 27    Post vital signs: Reviewed  Level of consciousness: sedated  Complications: No apparent anesthesia complications

## 2011-02-03 NOTE — Anesthesia Procedure Notes (Signed)
Procedure Name: LMA Insertion Date/Time: 02/03/2011 2:54 PM Performed by: Isabella Bowens Pre-anesthesia Checklist: Patient identified, Patient being monitored, Emergency Drugs available, Timeout performed and Suction available Patient Re-evaluated:Patient Re-evaluated prior to inductionOxygen Delivery Method: Circle System Utilized Preoxygenation: Pre-oxygenation with 100% oxygen Intubation Type: IV induction LMA: LMA inserted LMA Size: 4.0 Grade View: Grade III Number of attempts: 1 Placement Confirmation: positive ETCO2 and breath sounds checked- equal and bilateral

## 2011-02-03 NOTE — Transfer of Care (Signed)
Immediate Anesthesia Transfer of Care Note  Patient: Elizabeth Barnes  Procedure(s) Performed:  DILATATION & CURETTAGE/HYSTEROSCOPY WITH RESECTOSCOPE - Requests 1 hr.  Patient Location: PACU  Anesthesia Type: General  Level of Consciousness: awake, alert  and oriented  Airway & Oxygen Therapy: Patient Spontanous Breathing and Patient connected to nasal cannula oxygen  Post-op Assessment: Report given to PACU RN and Post -op Vital signs reviewed and stable  Post vital signs: Reviewed and stable  Complications: No apparent anesthesia complications

## 2011-02-03 NOTE — Anesthesia Preprocedure Evaluation (Signed)
Anesthesia Evaluation  Name, MR# and DOB Patient awake  General Assessment Comment  Reviewed: Allergy & Precautions, H&P , Patient's Chart, lab work & pertinent test results, reviewed documented beta blocker date and time   Airway Mallampati: II TM Distance: >3 FB Neck ROM: full    Dental No notable dental hx.    Pulmonary  clear to auscultation  Pulmonary exam normal       Cardiovascular regular Normal    Neuro/Psych    GI/Hepatic   Endo/Other  Morbid obesity  Renal/GU      Musculoskeletal   Abdominal   Peds  Hematology   Anesthesia Other Findings   Reproductive/Obstetrics                           Anesthesia Physical Anesthesia Plan  ASA: III  Anesthesia Plan: General   Post-op Pain Management:    Induction: Intravenous  Airway Management Planned: LMA  Additional Equipment:   Intra-op Plan:   Post-operative Plan:   Informed Consent: I have reviewed the patients History and Physical, chart, labs and discussed the procedure including the risks, benefits and alternatives for the proposed anesthesia with the patient or authorized representative who has indicated his/her understanding and acceptance.   Dental Advisory Given  Plan Discussed with: CRNA and Surgeon  Anesthesia Plan Comments: (  Discussed  general anesthesia, including possible nausea, instrumentation of airway, sore throat,pulmonary aspiration, etc. I asked if the were any outstanding questions, or  concerns before we proceeded. )        Anesthesia Quick Evaluation

## 2011-02-04 NOTE — Op Note (Signed)
[name], [name]                ACCOUNT NO.:  [id number]  MEDICAL RECORD NO.:  [id number]  LOCATION:  [facility]                          FACILITY:  [facility]  PHYSICIAN:  Maxie Better, M.D.DATE OF BIRTH:  December 11, 1980  DATE OF PROCEDURE:  02/03/2011 DATE OF DISCHARGE:  02/03/2011                              OPERATIVE REPORT   PREOPERATIVE DIAGNOSES:  Dysfunctional uterine bleeding, endometrial masses.  PROCEDURE:  Diagnostic hysteroscopy, hysteroscopic resection of multiple endometrial polyps, dilation and curettage.  POSTOPERATIVE DIAGNOSES:  Dysfunctional uterine bleeding, endometrial polyp.  ANESTHESIA:  General, paracervical block.  SURGEON:  Maxie Better, MD  ASSISTANT:  None.  PROCEDURE IN DETAIL:  Under adequate general anesthesia, the patient was placed in the dorsal lithotomy position.  She was sterilely prepped and draped in usual fashion.  Bladder was catheterized for concentrated urine.  Examination under anesthesia revealed a slightly enlarged uterus, no adnexal masses could be appreciated.  The patient was bleeding.  Bivalve speculum was placed in the vagina, 10 mL of 1% Nesacaine was injected paracervically at 3 and 9 o'clock position.  The anterior lip of the cervix was grasped with a single-tooth tenaculum. The cervix easily accepted a #31 Pratt dilator.  A resectoscope with a single loop was inserted, large amount of polypoid tissue was noted. The resectoscope was removed.  The cavity was then curetted. Resectoscope inserted and polypoid lesions were then systematically resected.  The tubal ostia could not be seen and was not seen at the end of the case.  The cavity was curetted and resection of polyps until no polypoid lesions were noted at which point, the procedure was then terminated by removing all instruments from the vagina.  Specimen was endometrial curetting with polyp sent to pathology.  ESTIMATED BLOOD LOSS:  Minimal.  FLUID DEFICIT:  500  mL.  COMPLICATION:  None.  The patient tolerated the procedure well, was transferred to recovery in stable condition.     Maxie Better, M.D.     [place]/MEDQ  D:  02/03/2011  T:  02/04/2011  Job:  [id number]

## 2011-07-24 ENCOUNTER — Encounter (HOSPITAL_COMMUNITY): Payer: Self-pay | Admitting: Pharmacist

## 2011-07-27 ENCOUNTER — Encounter (HOSPITAL_COMMUNITY)
Admission: RE | Admit: 2011-07-27 | Discharge: 2011-07-27 | Disposition: A | Discharge status: Home / Self Care | Payer: 59 | Source: Ambulatory Visit | Attending: Obstetrics and Gynecology | Admitting: Obstetrics and Gynecology

## 2011-07-27 ENCOUNTER — Encounter (HOSPITAL_COMMUNITY): Payer: Self-pay

## 2011-07-27 HISTORY — DX: Hereditary deficiency of other clotting factors: D68.2

## 2011-07-27 HISTORY — DX: Other specified postprocedural states: R11.2

## 2011-07-27 HISTORY — DX: Other specified postprocedural states: Z98.890

## 2011-07-27 HISTORY — DX: Other primary thrombophilia: D68.59

## 2011-07-27 LAB — CBC
HCT: 36.5 % (ref 36.0–46.0)
MCHC: 33.7 g/dL (ref 30.0–36.0)
MCV: 86.9 fL (ref 78.0–100.0)
Platelets: 264 10*3/uL (ref 150–400)
RDW: 13.8 % (ref 11.5–15.5)
WBC: 8.1 10*3/uL (ref 4.0–10.5)

## 2011-07-27 NOTE — Patient Instructions (Addendum)
   Your procedure is scheduled on: Monday, April 8th  Enter through the Main Entrance of [facility] at: 11:30am Pick up the phone at the desk and dial [phone] and inform us of your arrival.  Please call this number if you have any problems the morning of surgery: [phone]  Remember: Do not eat food after midnight: Sunday Do not drink clear liquids after: 9am Monday Take these medicines the morning of surgery with a SIP OF WATER: None  Do not wear jewelry, make-up, or FINGER nail polish Do not wear lotions, powders, perfumes or deodorant. Do not shave 48 hours prior to surgery. Do not bring valuables to the hospital. Contacts, dentures or bridgework may not be worn into surgery.  Patients discharged on the day of surgery will not be allowed to drive home.  Home with husband Jonny Ruiz cell [phone]   Remember to use your hibiclens as instructed.Please shower with 1/2 bottle the evening before your surgery and the other 1/2 bottle the morning of surgery. Neck down avoiding private area.

## 2011-07-28 ENCOUNTER — Other Ambulatory Visit: Payer: Self-pay | Admitting: Obstetrics and Gynecology

## 2011-08-01 MED ORDER — CEFAZOLIN SODIUM-DEXTROSE 2-3 GM-% IV SOLR
2.0000 g | INTRAVENOUS | Status: AC
  Administered 2011-08-02: 2 g via INTRAVENOUS
  Filled 2011-08-01: qty 50

## 2011-08-02 ENCOUNTER — Ambulatory Visit (HOSPITAL_COMMUNITY)
Admission: RE | Admit: 2011-08-02 | Discharge: 2011-08-03 | Disposition: A | Discharge status: Home / Self Care | Payer: 59 | Source: Ambulatory Visit | Attending: Obstetrics and Gynecology | Admitting: Obstetrics and Gynecology

## 2011-08-02 ENCOUNTER — Encounter (HOSPITAL_COMMUNITY): Payer: Self-pay | Admitting: Anesthesiology

## 2011-08-02 ENCOUNTER — Encounter (HOSPITAL_COMMUNITY)
Admission: RE | Disposition: A | Discharge status: Home / Self Care | Payer: Self-pay | Source: Ambulatory Visit | Attending: Obstetrics and Gynecology

## 2011-08-02 ENCOUNTER — Encounter (HOSPITAL_COMMUNITY): Payer: Self-pay

## 2011-08-02 ENCOUNTER — Ambulatory Visit (HOSPITAL_COMMUNITY): Payer: 59 | Admitting: Anesthesiology

## 2011-08-02 ENCOUNTER — Encounter (HOSPITAL_COMMUNITY): Payer: Self-pay | Admitting: *Deleted

## 2011-08-02 DIAGNOSIS — Z90710 Acquired absence of both cervix and uterus: Secondary | ICD-10-CM

## 2011-08-02 DIAGNOSIS — Z01818 Encounter for other preprocedural examination: Secondary | ICD-10-CM | POA: Insufficient documentation

## 2011-08-02 DIAGNOSIS — Z01812 Encounter for preprocedural laboratory examination: Secondary | ICD-10-CM | POA: Insufficient documentation

## 2011-08-02 DIAGNOSIS — D6859 Other primary thrombophilia: Secondary | ICD-10-CM | POA: Insufficient documentation

## 2011-08-02 DIAGNOSIS — N920 Excessive and frequent menstruation with regular cycle: Secondary | ICD-10-CM | POA: Insufficient documentation

## 2011-08-02 LAB — BASIC METABOLIC PANEL
Chloride: 102 mEq/L (ref 96–112)
Creatinine, Ser: 0.63 mg/dL (ref 0.50–1.10)
GFR calc Af Amer: >90 mL/min (ref >90)
Potassium: 3.8 mEq/L (ref 3.5–5.1)
Sodium: 138 mEq/L (ref 135–145)

## 2011-08-02 LAB — PROTIME-INR
INR: 1.04 (ref 0.00–1.49)
Prothrombin Time: 13.8 seconds (ref 11.6–15.2)

## 2011-08-02 LAB — HCG, SERUM, QUALITATIVE: Preg, Serum: NEGATIVE

## 2011-08-02 SURGERY — ROBOTIC ASSISTED TOTAL HYSTERECTOMY
Anesthesia: General | Site: Abdomen | Wound class: Clean Contaminated

## 2011-08-02 MED ORDER — IBUPROFEN 800 MG PO TABS
800.0000 mg | ORAL_TABLET | Freq: Three times a day (TID) | ORAL | Status: DC | PRN
  Administered 2011-08-02: 800 mg via ORAL
  Filled 2011-08-02: qty 1

## 2011-08-02 MED ORDER — SUFENTANIL CITRATE 50 MCG/ML IV SOLN
INTRAVENOUS | Status: DC | PRN
  Administered 2011-08-02 (×2): 10 ug via INTRAVENOUS
  Administered 2011-08-02: 20 ug via INTRAVENOUS
  Administered 2011-08-02: 10 ug via INTRAVENOUS

## 2011-08-02 MED ORDER — STERILE WATER FOR IRRIGATION IR SOLN
Status: DC | PRN
  Administered 2011-08-02: 1000 mL

## 2011-08-02 MED ORDER — MENTHOL 3 MG MT LOZG: 1.0000 | LOZENGE | OROMUCOSAL | Status: DC | PRN

## 2011-08-02 MED ORDER — ONDANSETRON HCL 4 MG/2ML IJ SOLN
INTRAMUSCULAR | Status: AC
  Filled 2011-08-02: qty 2

## 2011-08-02 MED ORDER — HYDROMORPHONE 0.3 MG/ML IV SOLN
INTRAVENOUS | Status: DC
  Administered 2011-08-02: 19:00:00 via INTRAVENOUS

## 2011-08-02 MED ORDER — NALOXONE HCL 0.4 MG/ML IJ SOLN
0.4000 mg | INTRAMUSCULAR | Status: DC | PRN
Start: 2011-08-02 — End: 2011-08-03

## 2011-08-02 MED ORDER — LACTATED RINGERS IR SOLN
Status: DC | PRN
  Administered 2011-08-02: 3000 mL

## 2011-08-02 MED ORDER — LIDOCAINE HCL (CARDIAC) 20 MG/ML IV SOLN
INTRAVENOUS | Status: AC
  Filled 2011-08-02: qty 5

## 2011-08-02 MED ORDER — OXYCODONE-ACETAMINOPHEN 5-325 MG PO TABS: 1.0000 | ORAL_TABLET | ORAL | Status: DC | PRN

## 2011-08-02 MED ORDER — WARFARIN - PHYSICIAN DOSING INPATIENT: Freq: Every day | Status: DC

## 2011-08-02 MED ORDER — GLYCOPYRROLATE 0.2 MG/ML IJ SOLN
INTRAMUSCULAR | Status: DC | PRN
  Administered 2011-08-02: 0.4 mg via INTRAVENOUS

## 2011-08-02 MED ORDER — KETOROLAC TROMETHAMINE 30 MG/ML IJ SOLN
15.0000 mg | Freq: Once | INTRAMUSCULAR | Status: AC | PRN
  Administered 2011-08-02: 30 mg via INTRAVENOUS

## 2011-08-02 MED ORDER — WARFARIN SODIUM 10 MG PO TABS
10.0000 mg | ORAL_TABLET | Freq: Every day | ORAL | Status: DC
  Filled 2011-08-02: qty 1

## 2011-08-02 MED ORDER — MIDAZOLAM HCL 2 MG/2ML IJ SOLN: 0.5000 mg | Freq: Once | INTRAMUSCULAR | Status: DC | PRN

## 2011-08-02 MED ORDER — PANTOPRAZOLE SODIUM 40 MG PO TBEC
40.0000 mg | DELAYED_RELEASE_TABLET | Freq: Every day | ORAL | Status: DC
  Administered 2011-08-02: 40 mg via ORAL
  Filled 2011-08-02 (×3): qty 1

## 2011-08-02 MED ORDER — DEXTROSE IN LACTATED RINGERS 5 % IV SOLN
INTRAVENOUS | Status: DC
  Administered 2011-08-02: 19:00:00 via INTRAVENOUS

## 2011-08-02 MED ORDER — ZOLPIDEM TARTRATE 5 MG PO TABS
5.0000 mg | ORAL_TABLET | Freq: Every evening | ORAL | Status: DC | PRN
Start: 2011-08-02 — End: 2011-08-03

## 2011-08-02 MED ORDER — ROCURONIUM BROMIDE 50 MG/5ML IV SOLN
INTRAVENOUS | Status: AC
  Filled 2011-08-02: qty 1

## 2011-08-02 MED ORDER — HYDROMORPHONE HCL PF 1 MG/ML IJ SOLN: 0.2000 mg | INTRAMUSCULAR | Status: DC | PRN

## 2011-08-02 MED ORDER — MEPERIDINE HCL 25 MG/ML IJ SOLN: 6.2500 mg | INTRAMUSCULAR | Status: DC | PRN

## 2011-08-02 MED ORDER — PROPOFOL 10 MG/ML IV EMUL
INTRAVENOUS | Status: DC | PRN
  Administered 2011-08-02: 170 mg via INTRAVENOUS
  Administered 2011-08-02: 30 mg via INTRAVENOUS

## 2011-08-02 MED ORDER — BUPIVACAINE-EPINEPHRINE PF 0.25-1:200000 % IJ SOLN
INTRAMUSCULAR | Status: AC
  Filled 2011-08-02: qty 30

## 2011-08-02 MED ORDER — MIDAZOLAM HCL 5 MG/5ML IJ SOLN
INTRAMUSCULAR | Status: DC | PRN
  Administered 2011-08-02: 2 mg via INTRAVENOUS

## 2011-08-02 MED ORDER — HYDROMORPHONE 0.3 MG/ML IV SOLN
INTRAVENOUS | Status: AC
  Filled 2011-08-02: qty 25

## 2011-08-02 MED ORDER — SODIUM CHLORIDE 0.9 % IJ SOLN: 9.0000 mL | INTRAMUSCULAR | Status: DC | PRN

## 2011-08-02 MED ORDER — DIPHENHYDRAMINE HCL 50 MG/ML IJ SOLN: 12.5000 mg | Freq: Four times a day (QID) | INTRAMUSCULAR | Status: DC | PRN

## 2011-08-02 MED ORDER — ONDANSETRON HCL 4 MG PO TABS: 4.0000 mg | ORAL_TABLET | Freq: Four times a day (QID) | ORAL | Status: DC | PRN

## 2011-08-02 MED ORDER — GLYCOPYRROLATE 0.2 MG/ML IJ SOLN
INTRAMUSCULAR | Status: AC
  Filled 2011-08-02: qty 2

## 2011-08-02 MED ORDER — LIDOCAINE HCL (CARDIAC) 20 MG/ML IV SOLN
INTRAVENOUS | Status: DC | PRN
  Administered 2011-08-02: 80 mg via INTRAVENOUS

## 2011-08-02 MED ORDER — FENTANYL CITRATE 0.05 MG/ML IJ SOLN
25.0000 ug | INTRAMUSCULAR | Status: DC | PRN
  Administered 2011-08-02: 50 ug via INTRAVENOUS

## 2011-08-02 MED ORDER — ONDANSETRON HCL 4 MG/2ML IJ SOLN: 4.0000 mg | Freq: Four times a day (QID) | INTRAMUSCULAR | Status: DC | PRN

## 2011-08-02 MED ORDER — PROMETHAZINE HCL 25 MG/ML IJ SOLN: 6.2500 mg | INTRAMUSCULAR | Status: DC | PRN

## 2011-08-02 MED ORDER — ROCURONIUM BROMIDE 100 MG/10ML IV SOLN
INTRAVENOUS | Status: DC | PRN
  Administered 2011-08-02: 50 mg via INTRAVENOUS
  Administered 2011-08-02: 20 mg via INTRAVENOUS

## 2011-08-02 MED ORDER — PROPOFOL 10 MG/ML IV EMUL
INTRAVENOUS | Status: AC
  Filled 2011-08-02: qty 40

## 2011-08-02 MED ORDER — SCOPOLAMINE 1 MG/3DAYS TD PT72
MEDICATED_PATCH | TRANSDERMAL | Status: AC
  Administered 2011-08-02: 1 via TRANSDERMAL
  Filled 2011-08-02: qty 1

## 2011-08-02 MED ORDER — FENTANYL CITRATE 0.05 MG/ML IJ SOLN
INTRAMUSCULAR | Status: AC
  Administered 2011-08-02: 50 ug via INTRAVENOUS
  Filled 2011-08-02: qty 2

## 2011-08-02 MED ORDER — KETOROLAC TROMETHAMINE 30 MG/ML IJ SOLN
INTRAMUSCULAR | Status: AC
  Filled 2011-08-02: qty 1

## 2011-08-02 MED ORDER — LACTATED RINGERS IV SOLN
INTRAVENOUS | Status: DC
  Administered 2011-08-02 (×3): via INTRAVENOUS

## 2011-08-02 MED ORDER — DIPHENHYDRAMINE HCL 12.5 MG/5ML PO ELIX: 12.5000 mg | ORAL_SOLUTION | Freq: Four times a day (QID) | ORAL | Status: DC | PRN

## 2011-08-02 MED ORDER — ARTIFICIAL TEARS OP OINT
TOPICAL_OINTMENT | OPHTHALMIC | Status: DC | PRN
  Administered 2011-08-02: 1 via OPHTHALMIC

## 2011-08-02 MED ORDER — NEOSTIGMINE METHYLSULFATE 1 MG/ML IJ SOLN
INTRAMUSCULAR | Status: AC
  Filled 2011-08-02: qty 10

## 2011-08-02 MED ORDER — ONDANSETRON HCL 4 MG/2ML IJ SOLN
INTRAMUSCULAR | Status: DC | PRN
  Administered 2011-08-02: 4 mg via INTRAVENOUS

## 2011-08-02 MED ORDER — SUFENTANIL CITRATE 50 MCG/ML IV SOLN
INTRAVENOUS | Status: AC
  Filled 2011-08-02: qty 1

## 2011-08-02 MED ORDER — CEFAZOLIN SODIUM 1-5 GM-% IV SOLN
1.0000 g | Freq: Three times a day (TID) | INTRAVENOUS | Status: AC
  Administered 2011-08-02 – 2011-08-03 (×2): 1 g via INTRAVENOUS
  Filled 2011-08-02 (×2): qty 50

## 2011-08-02 MED ORDER — DEXAMETHASONE SODIUM PHOSPHATE 10 MG/ML IJ SOLN
INTRAMUSCULAR | Status: AC
  Filled 2011-08-02: qty 1

## 2011-08-02 MED ORDER — NEOSTIGMINE METHYLSULFATE 1 MG/ML IJ SOLN
INTRAMUSCULAR | Status: DC | PRN
  Administered 2011-08-02: 2 mg via INTRAVENOUS

## 2011-08-02 MED ORDER — MIDAZOLAM HCL 2 MG/2ML IJ SOLN
INTRAMUSCULAR | Status: AC
  Filled 2011-08-02: qty 2

## 2011-08-02 MED ORDER — BUPIVACAINE HCL (PF) 0.25 % IJ SOLN
INTRAMUSCULAR | Status: DC | PRN
  Administered 2011-08-02: 12 mL

## 2011-08-02 MED ORDER — DEXAMETHASONE SODIUM PHOSPHATE 10 MG/ML IJ SOLN
INTRAMUSCULAR | Status: DC | PRN
  Administered 2011-08-02: 10 mg via INTRAVENOUS

## 2011-08-02 MED ORDER — ACETAMINOPHEN 10 MG/ML IV SOLN
1000.0000 mg | Freq: Once | INTRAVENOUS | Status: AC
  Administered 2011-08-02: 1000 mg via INTRAVENOUS
  Filled 2011-08-02: qty 100

## 2011-08-02 MED ORDER — ACETAMINOPHEN 325 MG PO TABS: 325.0000 mg | ORAL_TABLET | ORAL | Status: DC | PRN

## 2011-08-02 SURGICAL SUPPLY — 70 items
ADH SKN CLS APL DERMABOND .7 (GAUZE/BANDAGES/DRESSINGS) ×1
BAG URINE DRAINAGE (UROLOGICAL SUPPLIES) ×2 IMPLANT
BARRIER ADHS 3X4 INTERCEED (GAUZE/BANDAGES/DRESSINGS) ×1 IMPLANT
BRR ADH 4X3 ABS CNTRL BYND (GAUZE/BANDAGES/DRESSINGS)
CABLE HIGH FREQUENCY MONO STRZ (ELECTRODE) ×2 IMPLANT
CATH FOLEY 3WAY  5CC 16FR (CATHETERS) ×1
CATH FOLEY 3WAY 5CC 16FR (CATHETERS) ×1 IMPLANT
CHLORAPREP W/TINT 26ML (MISCELLANEOUS) ×2 IMPLANT
CLOTH BEACON ORANGE TIMEOUT ST (SAFETY) ×2 IMPLANT
CONT PATH 16OZ SNAP LID 3702 (MISCELLANEOUS) ×2 IMPLANT
COVER MAYO STAND STRL (DRAPES) ×2 IMPLANT
COVER TABLE BACK 60X90 (DRAPES) ×4 IMPLANT
COVER TIP SHEARS 8 DVNC (MISCELLANEOUS) ×1 IMPLANT
COVER TIP SHEARS 8MM DA VINCI (MISCELLANEOUS) ×1
DECANTER SPIKE VIAL GLASS SM (MISCELLANEOUS) ×1 IMPLANT
DERMABOND ADVANCED (GAUZE/BANDAGES/DRESSINGS) ×1
DERMABOND ADVANCED .7 DNX12 (GAUZE/BANDAGES/DRESSINGS) ×1 IMPLANT
DRAPE HUG U DISPOSABLE (DRAPE) ×2 IMPLANT
DRAPE LG THREE QUARTER DISP (DRAPES) ×4 IMPLANT
DRAPE MONITOR DA VINCI (DRAPE) IMPLANT
DRAPE ROBOTICS STRL (DRAPES) ×1 IMPLANT
DRAPE WARM FLUID 44X44 (DRAPE) ×2 IMPLANT
ELECT REM PT RETURN 9FT ADLT (ELECTROSURGICAL) ×2
ELECTRODE REM PT RTRN 9FT ADLT (ELECTROSURGICAL) ×1 IMPLANT
EVACUATOR SMOKE 8.L (FILTER) ×2 IMPLANT
GAUZE VASELINE 3X9 (GAUZE/BANDAGES/DRESSINGS) IMPLANT
GLOVE BIO SURGEON STRL SZ 6.5 (GLOVE) ×4 IMPLANT
GLOVE BIOGEL PI IND STRL 7.0 (GLOVE) ×3 IMPLANT
GLOVE BIOGEL PI INDICATOR 7.0 (GLOVE) ×3
GOWN STRL REIN XL XLG (GOWN DISPOSABLE) ×12 IMPLANT
HEMOSTAT SURGICEL 2X14 (HEMOSTASIS) ×1 IMPLANT
KIT ACCESSORY DA VINCI DISP (KITS) ×1
KIT ACCESSORY DVNC DISP (KITS) ×1 IMPLANT
KIT DISP ACCESSORY 4 ARM (KITS) IMPLANT
NDL INSUFFLATION 14GA 120MM (NEEDLE) ×1 IMPLANT
NDL INSUFFLATION 14GA 150MM (NEEDLE) IMPLANT
NEEDLE INSUFFLATION 14GA 120MM (NEEDLE) ×2 IMPLANT
NEEDLE INSUFFLATION 14GA 150MM (NEEDLE) ×2 IMPLANT
OCCLUDER COLPOPNEUMO (BALLOONS) ×1 IMPLANT
PACK LAVH (CUSTOM PROCEDURE TRAY) ×2 IMPLANT
PAD PREP 24X48 CUFFED NSTRL (MISCELLANEOUS) ×4 IMPLANT
PLUG CATH AND CAP STER (CATHETERS) ×2 IMPLANT
PROTECTOR NERVE ULNAR (MISCELLANEOUS) ×4 IMPLANT
SCISSORS LAP 5X35 DISP (ENDOMECHANICALS) IMPLANT
SET CYSTO W/LG BORE CLAMP LF (SET/KITS/TRAYS/PACK) ×1 IMPLANT
SET IRRIG TUBING LAPAROSCOPIC (IRRIGATION / IRRIGATOR) ×2 IMPLANT
SOLUTION ELECTROLUBE (MISCELLANEOUS) ×2 IMPLANT
SPONGE LAP 18X18 X RAY DECT (DISPOSABLE) IMPLANT
SUT VIC AB 0 CT1 27 (SUTURE) ×10
SUT VIC AB 0 CT1 27XBRD ANTBC (SUTURE) ×5 IMPLANT
SUT VICRYL 0 UR6 27IN ABS (SUTURE) ×2 IMPLANT
SUT VICRYL 4-0 PS2 18IN ABS (SUTURE) ×4 IMPLANT
SYR 50ML LL SCALE MARK (SYRINGE) ×3 IMPLANT
SYSTEM CONVERTIBLE TROCAR (TROCAR) IMPLANT
TIP UTERINE 5.1X6CM LAV DISP (MISCELLANEOUS) IMPLANT
TIP UTERINE 6.7X10CM GRN DISP (MISCELLANEOUS) ×1 IMPLANT
TIP UTERINE 6.7X6CM WHT DISP (MISCELLANEOUS) IMPLANT
TIP UTERINE 6.7X8CM BLUE DISP (MISCELLANEOUS) IMPLANT
TOWEL OR 17X24 6PK STRL BLUE (TOWEL DISPOSABLE) ×6 IMPLANT
TROCAR 12M 150ML BLUNT (TROCAR) IMPLANT
TROCAR 5M 150ML BLDLS (TROCAR) ×1 IMPLANT
TROCAR DISP BLADELESS 8 DVNC (TROCAR) IMPLANT
TROCAR DISP BLADELESS 8MM (TROCAR) ×1
TROCAR XCEL 12X100 BLDLESS (ENDOMECHANICALS) ×1 IMPLANT
TROCAR XCEL NON-BLD 5MMX100MML (ENDOMECHANICALS) ×1 IMPLANT
TROCAR Z-THREAD 12X150 (TROCAR) ×2 IMPLANT
TROCAR Z-THREAD BLADED 12X100M (TROCAR) ×2 IMPLANT
TUBING FILTER THERMOFLATOR (ELECTROSURGICAL) ×2 IMPLANT
WARMER LAPAROSCOPE (MISCELLANEOUS) ×2 IMPLANT
WATER STERILE IRR 1000ML POUR (IV SOLUTION) ×6 IMPLANT

## 2011-08-02 NOTE — Transfer of Care (Signed)
Immediate Anesthesia Transfer of Care Note  Patient: Elizabeth Barnes  Procedure(s) Performed: Procedure(s) (LRB): ROBOTIC ASSISTED TOTAL HYSTERECTOMY (N/A) ROBOTIC ASSISTED LAPAROSCOPIC LYSIS OF ADHESION (N/A)  Patient Location: PACU  Anesthesia Type: General  Level of Consciousness: sedated  Airway & Oxygen Therapy: Patient Spontanous Breathing and Patient connected to nasal cannula oxygen  Post-op Assessment: Report given to PACU RN and Post -op Vital signs reviewed and stable  Post vital signs: stable  Complications: No apparent anesthesia complications

## 2011-08-02 NOTE — Anesthesia Postprocedure Evaluation (Signed)
Anesthesia Post Note  Patient: Elizabeth Barnes  Procedure(s) Performed: Procedure(s) (LRB): ROBOTIC ASSISTED TOTAL HYSTERECTOMY (N/A) ROBOTIC ASSISTED LAPAROSCOPIC LYSIS OF ADHESION (N/A)  Anesthesia type: General  Patient location: PACU  Post pain: Pain level controlled  Post assessment: Post-op Vital signs reviewed  Last Vitals:  Filed Vitals:   08/02/11 1800  BP: 120/79  Pulse: 91  Temp:   Resp: 17    Post vital signs: Reviewed  Level of consciousness: sedated  Complications: No apparent anesthesia complications

## 2011-08-02 NOTE — Brief Op Note (Signed)
08/02/2011  4:40 PM  PATIENT:  Elizabeth Barnes  32 y.o. female  PRE-OPERATIVE DIAGNOSIS:  Menorrhagia, History of bilateral pulmonary emboli, Protein C and S Deficiency  POST-OPERATIVE DIAGNOSIS:  menorrhagia, history of bilateral pulmonary emboli, Protein C, S deficiency  PROCEDURE:  Procedure(s) (LRB): DAVINCI ROBOTIC ASSISTED TOTAL HYSTERECTOMY, BILATERAL SALPINGECTOMY (N/A) ROBOTIC ASSISTED LYSIS OF ADHESION (N/A)  SURGEON:  Surgeon(s) and Role:    * Collins Dimaria Cathie Beams, MD - Primary    * Genia Del, MD - Assisting  PHYSICIAN ASSISTANT:   ASSISTANTS: Genia Del, M.D   ANESTHESIA:   general  EBL:  Total I/O In: 69 [I.V.:2000] Out: 410 [Urine:360; Blood:50], FINDINGS: BILATERAL POLYCYSTIC OVARIES, BLADDER AND PELVIC SIDEWALL ADHESIONS ADHESIONS, OMENTAL ADHESIONS TO ANT ABD WALL, NL TUBES, NL LIVER EDGE BLOOD ADMINISTERED:none  DRAINS: none   LOCAL MEDICATIONS USED:  MARCAINE     SPECIMEN:  Source of Specimen:  uterus with cervix, both tubes  DISPOSITION OF SPECIMEN:  PATHOLOGY  COUNTS:  YES  TOURNIQUET:  * No tourniquets in log *  DICTATION: .Other Dictation: Dictation Number [id number]  PLAN OF CARE: Admit for overnight observation  PATIENT DISPOSITION:  PACU - hemodynamically stable.   Delay start of Pharmacological VTE agent (>24hrs) due to surgical blood loss or risk of bleeding: no

## 2011-08-02 NOTE — Anesthesia Preprocedure Evaluation (Signed)
Anesthesia Evaluation  Patient identified by MRN, date of birth, ID band Patient awake    Reviewed: Allergy & Precautions, H&P , Patient's Chart, lab work & pertinent test results, reviewed documented beta blocker date and time   History of Anesthesia Complications (+) PONV  Airway Mallampati: III TM Distance: >3 FB Neck ROM: full    Dental No notable dental hx.    Pulmonary neg pulmonary ROS,  breath sounds clear to auscultation  Pulmonary exam normal       Cardiovascular Exercise Tolerance: Good negative cardio ROS  Rhythm:regular Rate:Normal     Neuro/Psych negative neurological ROS  negative psych ROS   GI/Hepatic negative GI ROS, Neg liver ROS,   Endo/Other  negative endocrine ROSMorbid obesity  Renal/GU negative Renal ROS     Musculoskeletal   Abdominal   Peds  Hematology negative hematology ROS (+)   Anesthesia Other Findings PONV (postoperative nausea and vomiting)     DVT (deep venous thrombosis) 2005 left leg with subsequent Pulmonary Embolis-on chronic coumadin, was d/c  1/06-then had another pe 6/06    Pulmonary embolism 2005,2006 on chronic coumadin therapy-managed by [facility] Protein C deficiency        Protein S deficiency     AC globulin factor II (prothrombin) deficiency    Reproductive/Obstetrics negative OB ROS                           Anesthesia Physical Anesthesia Plan  ASA: III  Anesthesia Plan: General ETT   Post-op Pain Management:    Induction:   Airway Management Planned:   Additional Equipment:   Intra-op Plan:   Post-operative Plan:   Informed Consent: I have reviewed the patients History and Physical, chart, labs and discussed the procedure including the risks, benefits and alternatives for the proposed anesthesia with the patient or authorized representative who has indicated his/her understanding and acceptance.   Dental  Advisory Given  Plan Discussed with: CRNA and Surgeon  Anesthesia Plan Comments:         Anesthesia Quick Evaluation

## 2011-08-03 LAB — BASIC METABOLIC PANEL
Calcium: 8.8 mg/dL (ref 8.4–10.5)
GFR calc non Af Amer: >90 mL/min (ref >90)
Glucose, Bld: 105 mg/dL — ABNORMAL HIGH (ref 70–99)
Potassium: 3.8 mEq/L (ref 3.5–5.1)
Sodium: 138 mEq/L (ref 135–145)

## 2011-08-03 LAB — CBC
MCV: 89.4 fL (ref 78.0–100.0)
Platelets: 269 10*3/uL (ref 150–400)
RDW: 14 % (ref 11.5–15.5)
WBC: 13 10*3/uL — ABNORMAL HIGH (ref 4.0–10.5)

## 2011-08-03 LAB — PROTIME-INR
INR: 1.11 (ref 0.00–1.49)
Prothrombin Time: 14.5 seconds (ref 11.6–15.2)

## 2011-08-03 MED ORDER — OXYCODONE-ACETAMINOPHEN 5-325 MG PO TABS: 1.0000 | ORAL_TABLET | ORAL | Status: AC | PRN

## 2011-08-03 NOTE — Anesthesia Postprocedure Evaluation (Signed)
  Anesthesia Post-op Note  Patient: Elizabeth Barnes  Procedure(s) Performed: Procedure(s) (LRB): ROBOTIC ASSISTED TOTAL HYSTERECTOMY (N/A) ROBOTIC ASSISTED LAPAROSCOPIC LYSIS OF ADHESION (N/A)  Patient Location: PACU and Women's Unit  Anesthesia Type: General  Level of Consciousness: awake, alert  and oriented  Airway and Oxygen Therapy: Patient Spontanous Breathing  Post-op Pain: mild  Post-op Assessment: Patient's Cardiovascular Status Stable, Respiratory Function Stable, No signs of Nausea or vomiting, Adequate PO intake and Pain level controlled  Post-op Vital Signs: stable  Complications: No apparent anesthesia complications

## 2011-08-03 NOTE — Addendum Note (Signed)
Addendum  created 08/03/11 1610 by Elbert Ewings, CRNA   Modules edited:Notes Section

## 2011-08-03 NOTE — Op Note (Signed)
Elizabeth Barnes, Elizabeth Barnes                ACCOUNT NO.:  [id number]  MEDICAL RECORD NO.:  [id number]  LOCATION:  9302                          FACILITY:  [facility]  PHYSICIAN:  Maxie Better, M.D.DATE OF BIRTH:  1980-07-07  DATE OF PROCEDURE:  08/02/2011 DATE OF DISCHARGE:                              OPERATIVE REPORT   PREOPERATIVE DIAGNOSES:  Menorrhagia, protein C and S deficiency, history of previous cesarean section.  PROCEDURES:  Da Vinci robotic total hysterectomy, bilateral salpingectomy, lysis of adhesions.  POSTOPERATIVE DIAGNOSES:  Abdominal wall adhesions, menorrhagia, protein S and C deficiency, history of bilateral pulmonary embolism, previous cesarean section.  ANESTHESIA:  General.  SURGEON:  Maxie Better, M.D.  ASSISTANTS:  Genia Del, M.D. Arlan Organ, CNM.  DESCRIPTION OF THE PROCEDURE:  Under adequate general anesthesia, the patient was placed in the dorsal lithotomy position.  She was positioned for robotic surgery.  The patient was noted to having heavy vaginal bleeding.  Examination under anesthesia revealed a slightly enlarged uterus.  No adnexal masses could be really appreciated, but limited by the patient's body habitus.  The patient was sterilely prepped and draped in usual fashion.  A three-way Foley was placed sterilely.  The patient had a very elongated vagina with no descent of the cervix. Using retractors anteriorly and posteriorly, the cervix was found in the upper vagina slightly deviated to the patient's left.  A single-tooth tenaculum was placed on the anterior lip.  The posterior lip was still difficult to see and a single-tooth tenaculum was eventually  placed on the posterior lip as well.  Figure-of-eight 0 Vicryl sutures were then placed on the anterior and posterior lip of the cervix.  The uterus sounded to 10 cm. A #10 uterine manipulator with a small RUMI KOH ring with introducer was placed without difficulty.  The retractor  was removed.  Attention was then turned to the abdomen.  Though the patient was obese, she had a short trunk.  Supraumbilical vertical incision was made after 0.25% Marcaine was injected.  Incision was then made.  Veress needle was introduced and the needle was then readjusted.  Carbon dioxide was then insufflated, opening pressure of 5 was noted.  Four liters of CO2 was insufflated.  Veress needle was then removed.  A 12 mm disposable trocar with sleeve was introduced into the abdomen without incident.  The robotic camera was then placed through that port.  On inspection, it was then noted there was omental adhesion adherent to clearly the umbilicus Down partially towards the lower abdomen.  The liver edge was normal.  The uterus was noted to be slightly enlarged, bladder adhesions anteriorly from the previous cesarean section, large bilateral ovaries, normal fallopian tubes.  Two 8-mm robotic ports were placed on the left one hand's breadth away from each other and on the right a 5 mm assistant port in right lower quadrant and another 8 mm robotic port was placed between the camera site and the assistant port.  Once these were placed, the robot was docked on the patient's left side.  The ProGrasp, the PK dissector, and the monopolar scissors were then placed.  Once this was done, I went to the surgical  console.  At the surgical console, the adhesions to the anterior abdominal wall, which limited ability to see well in the pelvis, was carefully lysed.  Once this was done, pelvis was inspected. Some areas of endometriotic implant was noted posteriorly.  Both ovaries again were enlarged consistent with polycystic ovarian disease.  Some adhesions of the bowel to the left sidewall was lysed.  Once inspection was done, the ureter on the right was found to be deep in the pelvis and peristalsing.  The hysterectomy was begun by cauterizing the round ligament on the right and then cutting, taking  mesosalpinx of the right fallopian tube and serially clamped, cauterized, and cut, followed by the right utero-ovarian ligament being serially clamped, cauterized, and then cut.  The uterine vessel on the right was somewhat noted.  It was then slightly skeletonized.  Anterior leaf of the broad ligament was opened.  However, there was some adhesions at the bladder.  The bladder was therefore retrograde filled and then the dissection on the bladder anteriorly was then continued.  Attention was then turned to the left side.  The ureter was then easily seen.  The left broad ligament was clamped, cauterized, and then cut.  The retroperitoneal space was opened and gentle dissection was performed.  The ureter was then found on the medial leaf on the left side below a defect in the window of the posterior leaf.  The left utero-ovarian ligament was serially clamped, cauterized, and then cut.  Those left anterior adhesions to the uterus which was limiting the ability to manipulate the uterus.  This was ultimately lysed along with the vesicouterine peritoneum further developed to the bladder retro filled.  Once this was done, the bladder was deflated again.  The uterine vessels were then bilaterally cauterized, clamped sequentially.  Once this was done and the uterine space was noted to be blanching, decision was then made to separate the cervix from its vaginal attachment posteriorly.  This was started, carried around partially and then anteriorly further dissection of the bladder and followed by incision along the cervicovaginal junction was performed and then the cervix was detached from the vagina.  Once this was done, the specimen was able to be taken out through the vagina.  The left fallopian tube was grasped with a third arm and serially clamped, cauterized, and then cut and ultimately removed as well.  The vaginal cuff was then closed with 0 Vicryl figure-of-eight sutures, tested  by bimanual exam to make sure this well approximated which was.  The endometriotic implant was excised on the left posterior cul-de-sac small piece and was not sent.  The other areas were not as clear once the uterus was gone.  The abdomen was irrigated and suctioned with good hemostasis noted.  The instruments were then removed.  The robot undocked.  The sutures were then removed laparoscopically.  The abdomen was deflated and the incisions were then closed with a subcuticular 4-0 Vicryl sutures, 0 Vicryl for the supraumbilical site and closure of all incisions.  Skin with 4-0 Vicryl subcuticular.  Reinspection on bimanual exam showed a well-approximated vaginal cuff.  Specimen was uterus and both fallopian tubes with cervix sent to pathology.  Estimated blood loss was 50 mL. Intraoperative fluid 2 L.  Urine output 365 mL of clear yellow urine.  Sponge, instrument x2 was correct.  Complication was none.  The patient tolerated procedure well, was transferred to recovery room in stable condition.     Maxie Better, M.D.  [place]/MEDQ  D:  08/02/2011  T:  08/03/2011  Job:  [id number]

## 2011-08-03 NOTE — Discharge Summary (Signed)
Physician Discharge Summary  Patient ID: Elizabeth Barnes MRN: [id number] DOB/AGE: Aug 25, 1980 30 y.o.  Admit date: 08/02/2011 Discharge date: 08/03/2011  Admission Diagnoses: menometrorrhagia, protein C,S deficiency, previous C-section  Discharge Diagnoses: menometrrohagia, protein C, S deficiency, previous C-section, abdominopelvic adhesions Active Problems:  * No active hospital problems. *   Chronic anticoagulation Protein C, S deficiency  Discharged Condition: stable  Hospital Course: uncomplicated postop course. Pt underwent davinci robotic TLH, bilateral salpingectomy, LOA. Will resume coumadin per PCP mgmt  Consults: None  Significant Diagnostic Studies: labs: hgb 11.1 hct 34.4  plt 269K wbc 13.0  Treatments: surgery: DaVinci robotic TLH, bilateral salpingectomy,LOA  Discharge Exam: Blood pressure 105/69, pulse 93, temperature 98.6 F (37 C), temperature source Oral, resp. rate 18, height 5' 3.25" (1.607 m), weight 106.595 kg (235 lb), last menstrual period 06/10/2011, SpO2 98.00%. General appearance: alert, cooperative and no distress Resp: clear to auscultation bilaterally Cardio: regular rate and rhythm, S1, S2 normal, no murmur, click, rub or gallop GI: soft, non-tender; bowel sounds normal; no masses,  no organomegaly Pelvic: deferred and pad none Extremities: Homans sign is negative, no sign of DVT  [facility]  Discharge Orders    Future Orders Please Complete By Expires   Diet - low sodium heart healthy      Increase activity slowly      Discharge instructions      Comments:   Call if temperature greater than equal to 100.4, nothing per vagina for 4-6 weeks or severe nausea vomiting, increased incisional pain , drainage or redness in the incision site, no straining with bowel movements, showers no bath   No dressing needed      May walk up steps        Medication List  As of 08/03/2011 11:53 AM   TAKE these medications        cholecalciferol 1000 UNITS tablet   Commonly known as: VITAMIN D   Take 2,000 Units by mouth daily.      medroxyPROGESTERone 10 MG tablet   Commonly known as: PROVERA   Take 10 mg by mouth 2 (two) times daily. Take as directed      mulitivitamin with minerals Tabs   Take 1 tablet by mouth daily.      oxyCODONE-acetaminophen 5-325 MG per tablet   Commonly known as: PERCOCET   Take 1-2 tablets by mouth every 3 (three) hours as needed (moderate to severe pain (when tolerating fluids)).      warfarin 5 MG tablet   Commonly known as: COUMADIN   Take 10 mg by mouth daily. Take two tablets (10 mg) by mouth Saturday through Thursday.  Take two and a half tablets (12.5 mg) on Friday.           Follow-up Information    Follow up with Larsen Zettel A, MD in 2 weeks. (Ted Leonhart)    Contact information:   [address] [place] [address] [phone]          Signed: Serita Kyle 08/03/2011, 11:53 AM

## 2011-08-03 NOTE — Discharge Instructions (Signed)
Call if temperature greater than equal to 100.4, nothing per vagina for 4-6 weeks or severe nausea vomiting, increased incisional pain , drainage or redness in the incision site, no straining with bowel movements, showers no bath °

## 2011-08-03 NOTE — Progress Notes (Signed)
Pt is discharged in the care of husband. Downstairs per ambulatory. Lapsitesx5 are clean and dry No drainage. Scanty amt pf vaginal bleeding noted on V-Pad. Denies pain or discomfort. spirts are good.

## 2011-11-16 ENCOUNTER — Telehealth: Payer: Self-pay | Admitting: Medical Oncology

## 2011-11-16 ENCOUNTER — Telehealth: Payer: Self-pay | Admitting: *Deleted

## 2011-11-16 NOTE — Telephone Encounter (Signed)
LMOVM regarding schedule change for 8/1.  Informed patient of new times, 1230 for labwork and 100 to see Dr. Welton Flakes.  Instructed patient to call to confirm and with any questions.

## 2011-11-16 NOTE — Telephone Encounter (Signed)
patient confirmed over the phone the new date and time on 11-25-2011 starting at 1:30pm with labs

## 2011-11-17 ENCOUNTER — Other Ambulatory Visit: Payer: Self-pay | Admitting: Medical Oncology

## 2011-11-17 ENCOUNTER — Telehealth: Payer: Self-pay | Admitting: *Deleted

## 2011-11-17 DIAGNOSIS — I2699 Other pulmonary embolism without acute cor pulmonale: Secondary | ICD-10-CM

## 2011-11-17 NOTE — Telephone Encounter (Signed)
md has the charts waiting instructions to move the patient at another date and time per the md

## 2011-11-18 ENCOUNTER — Telehealth: Payer: Self-pay | Admitting: *Deleted

## 2011-11-18 NOTE — Telephone Encounter (Signed)
Per Horton Marshall she contacted the patient and confirmed over the phone the new time

## 2011-11-19 ENCOUNTER — Telehealth: Payer: Self-pay | Admitting: Oncology

## 2011-11-19 NOTE — Telephone Encounter (Signed)
NP packet mailed out.

## 2011-11-25 ENCOUNTER — Ambulatory Visit: Payer: 59 | Admitting: Oncology

## 2011-11-25 ENCOUNTER — Encounter: Payer: Self-pay | Admitting: Oncology

## 2011-11-25 ENCOUNTER — Other Ambulatory Visit: Payer: 59 | Admitting: Lab

## 2011-11-25 ENCOUNTER — Ambulatory Visit (HOSPITAL_BASED_OUTPATIENT_CLINIC_OR_DEPARTMENT_OTHER): Payer: 59 | Admitting: Oncology

## 2011-11-25 VITALS — BP 124/84 | HR 86 | Temp 99.0°F | Ht 63.0 in | Wt 238.3 lb

## 2011-11-25 DIAGNOSIS — D6859 Other primary thrombophilia: Secondary | ICD-10-CM

## 2011-11-25 DIAGNOSIS — I821 Thrombophlebitis migrans: Secondary | ICD-10-CM

## 2011-11-25 DIAGNOSIS — I2699 Other pulmonary embolism without acute cor pulmonale: Secondary | ICD-10-CM

## 2011-11-25 DIAGNOSIS — Z86718 Personal history of other venous thrombosis and embolism: Secondary | ICD-10-CM

## 2011-11-25 DIAGNOSIS — Z86711 Personal history of pulmonary embolism: Secondary | ICD-10-CM

## 2011-11-25 DIAGNOSIS — Z7901 Long term (current) use of anticoagulants: Secondary | ICD-10-CM

## 2011-11-25 DIAGNOSIS — Z8672 Personal history of thrombophlebitis: Secondary | ICD-10-CM

## 2011-11-25 LAB — CBC WITH DIFFERENTIAL/PLATELET
BASO%: 1.4 % (ref 0.0–2.0)
EOS%: 2.5 % (ref 0.0–7.0)
MCH: 28.4 pg (ref 25.1–34.0)
MCHC: 33.8 g/dL (ref 31.5–36.0)
MONO#: 0.4 10*3/uL (ref 0.1–0.9)
RBC: 4.68 10*6/uL (ref 3.70–5.45)
RDW: 14.6 % — ABNORMAL HIGH (ref 11.2–14.5)
WBC: 6.3 10*3/uL (ref 3.9–10.3)
lymph#: 1.6 10*3/uL (ref 0.9–3.3)

## 2011-11-25 LAB — COMPREHENSIVE METABOLIC PANEL
ALT: 18 U/L (ref 0–35)
Alkaline Phosphatase: 65 U/L (ref 39–117)
CO2: 27 mEq/L (ref 19–32)
Creatinine, Ser: 0.71 mg/dL (ref 0.50–1.10)
Sodium: 137 mEq/L (ref 135–145)
Total Bilirubin: 0.4 mg/dL (ref 0.3–1.2)

## 2011-11-25 LAB — PROTIME-INR
INR: 2.8 (ref 2.00–3.50)
Protime: 33.6 Seconds — ABNORMAL HIGH (ref 10.6–13.4)

## 2011-11-25 NOTE — Patient Instructions (Addendum)
Please call my office to set up another appointment when you have made your decision about xeralto

## 2011-11-25 NOTE — Progress Notes (Signed)
OFFICE PROGRESS NOTE  CC  Elizabeth Carina, PA-C [address] [place] [address]  DIAGNOSIS: -year-old female with protein C and protein S. Deficiency as well as heterozygosity for prothrombin gene mutation.  PRIOR THERAPY:  #1 patient has a history of thrombophlebitis as well as pulmonary embolism in the past.While she was pregnant patient did receive anticoagulation throughout her pregnancy with Lovenox. She successfully delivered a baby girl who is now 48 months old. Since then patient has been on Coumadin and her INRs have been monitored by her primary care physician's office.  CURRENT THERAPY:Coumadin to keep her INR above 2  INTERVAL HISTORY: Elizabeth Barnes 31 y.o. female returns for Followup visit her last visit with me was back in March 2012 so I haven't seen her in quite some time. She remains on Coumadin indefinitely since she has had pulmonary embolisms. Patient was recently seen by her primary care physician. Who discussed the possibility of the patient going on xeralto of staying on Coumadin since there is no monitoring required xeralto.With any other thrombosis at all.She is otherwise without any other problems.  MEDICAL HISTORY: Past Medical History  Diagnosis Date  . DVT (deep venous thrombosis) 2005    left leg with subsequent Pulmonary Embolis-on chronic coumadin, was d/c  1/06-then had another pe 6/06  . Pulmonary embolism 2005,2006    on chronic coumadin therapy-managed by [facility]  . Protein C deficiency   . PONV (postoperative nausea and vomiting)   . Protein S deficiency   . AC globulin factor II (prothrombin) deficiency     ALLERGIES:   has no known allergies.  MEDICATIONS:  Current Outpatient Prescriptions  Medication Sig Dispense Refill  . cholecalciferol (VITAMIN D) 1000 UNITS tablet Take 2,000 Units by mouth daily.        . medroxyPROGESTERone (PROVERA) 10 MG tablet Take 10 mg by mouth 2 (two) times daily. Take as directed      .  warfarin (COUMADIN) 5 MG tablet Take 10 mg by mouth daily. Take two tablets (10 mg) by mouth Saturday through Thursday.  Take two and a half tablets (12.5 mg) on Friday.      . Multiple Vitamin (MULITIVITAMIN WITH MINERALS) TABS Take 1 tablet by mouth daily.        SURGICAL HISTORY:  Past Surgical History  Procedure Date  . Cesarean section 2012  . Appendectomy     as child  . Tonsillectomy     as child  . Diagnostic laparoscopy 2008  . Dilation and curettage of uterus 2012    resection of polyp    ECOG PERFORMANCE STATUS: 0 - Asymptomatic  Blood pressure 124/84, pulse 86, temperature 99 F (37.2 C), temperature source Oral, height 5\' 3"  (1.6 m), weight 238 lb 4.8 oz (108.092 kg).  LABORATORY DATA: Lab Results  Component Value Date   WBC 13.0* 08/03/2011   HGB 11.1* 08/03/2011   HCT 34.4* 08/03/2011   MCV 89.4 08/03/2011   PLT 269 08/03/2011      Chemistry      Component Value Date/Time   NA 138 08/03/2011 0525   NA 139 12/03/2008 0958   K 3.8 08/03/2011 0525   K 4.1 12/03/2008 0958   CL 105 08/03/2011 0525   CL 103 12/03/2008 0958   CO2 22 08/03/2011 0525   CO2 29 12/03/2008 0958   BUN 9 08/03/2011 0525   BUN 13 12/03/2008 0958   CREATININE 0.67 08/03/2011 0525   CREATININE 0.6 12/03/2008 9811  Component Value Date/Time   CALCIUM 8.8 08/03/2011 0525   CALCIUM 9.1 12/03/2008 0958   ALKPHOS 51 11/18/2008 1533   AST 19 11/18/2008 1533   ALT 17 11/18/2008 1533   BILITOT 0.3 11/18/2008 1533       RADIOGRAPHIC STUDIES:  No results found.  ASSESSMENT:  31 year old female with history of protein C protein as deficiency as well as heterozygosity for prothrombin gene mutation. Patient with a personal history of having had pulmonary embolisms and thrombophlebitis. Patient is on anticoagulation with Coumadin indefinitely As stated in finding out more about the use of alternative anticoagulants. Patient and I had an extensive discussion today regarding xeralto usage in patients with  hypercoagulability. Certainly there is literature and there is experienced as well. We discussed the potential benefits of being on this which would be no monitoring. We also discussed risks since there is no monitoring we don't know if indeed patient is anticoagulated appropriately also bleeding risks and inability to reverse the anticoagulant effect in case of an emergency. I did give the patient literature on this. And she will call me to see if she would like to be on xeralto in the future.   PLAN: [name] will review the literature and let me know her decision and then we will plan on getting her converted over. Patient will call me to initiate her plans.   All questions were answered. The patient knows to call the clinic with any problems, questions or concerns. We can certainly see the patient much sooner if necessary.  I spent 25 minutes counseling the patient face to face. The total time spent in the appointment was 30 minutes.   Drue Second, MD Medical/Oncology [facility] [phone] (beeper) [phone] (Office)  11/25/2011, 1:39 PM

## 2011-11-26 ENCOUNTER — Telehealth: Payer: Self-pay | Admitting: *Deleted

## 2011-11-26 NOTE — Telephone Encounter (Signed)
patient to call the office for follow up appointment

## 2011-12-02 ENCOUNTER — Telehealth: Payer: Self-pay | Admitting: *Deleted

## 2011-12-02 NOTE — Telephone Encounter (Signed)
Pt called LMOVM states " I've read the side effects of xeralto and I'm just going to stick with Coumadin. " Called pt confirmed she will continue on Coumadin. Pt advised her PCP follows her for labs/dosing. Pt denied needing further assistance

## 2011-12-29 ENCOUNTER — Telehealth: Payer: Self-pay | Admitting: *Deleted

## 2011-12-29 NOTE — Telephone Encounter (Signed)
Call from [place], [place] regarding pt visit on 8/1. Discussed MD's note states pt will review information on xeralto and call to initiate therapy. 12/02/11 telephone note pt called to advise she will continue on Coumadin as her PCP follows her labs/dosing. Note faxed to Erin,PA at [phone] per her request

## 2013-09-12 ENCOUNTER — Other Ambulatory Visit: Payer: Self-pay | Admitting: Dermatology

## 2013-09-17 ENCOUNTER — Emergency Department (HOSPITAL_COMMUNITY)
Admission: EM | Admit: 2013-09-17 | Discharge: 2013-09-17 | Disposition: A | Discharge status: Home / Self Care | Payer: BC Managed Care – PPO | Attending: Emergency Medicine | Admitting: Emergency Medicine

## 2013-09-17 ENCOUNTER — Encounter (HOSPITAL_COMMUNITY): Payer: Self-pay | Admitting: Emergency Medicine

## 2013-09-17 ENCOUNTER — Emergency Department (HOSPITAL_COMMUNITY): Payer: BC Managed Care – PPO

## 2013-09-17 DIAGNOSIS — Z7901 Long term (current) use of anticoagulants: Secondary | ICD-10-CM | POA: Insufficient documentation

## 2013-09-17 DIAGNOSIS — R0789 Other chest pain: Secondary | ICD-10-CM | POA: Insufficient documentation

## 2013-09-17 DIAGNOSIS — R079 Chest pain, unspecified: Secondary | ICD-10-CM

## 2013-09-17 DIAGNOSIS — Z86711 Personal history of pulmonary embolism: Secondary | ICD-10-CM | POA: Insufficient documentation

## 2013-09-17 DIAGNOSIS — Z86718 Personal history of other venous thrombosis and embolism: Secondary | ICD-10-CM | POA: Insufficient documentation

## 2013-09-17 DIAGNOSIS — D6859 Other primary thrombophilia: Secondary | ICD-10-CM | POA: Insufficient documentation

## 2013-09-17 LAB — I-STAT CHEM 8, ED
BUN: 13 mg/dL (ref 6–23)
CALCIUM ION: 1.2 mmol/L (ref 1.12–1.23)
CREATININE: 0.7 mg/dL (ref 0.50–1.10)
Chloride: 103 mEq/L (ref 96–112)
GLUCOSE: 87 mg/dL (ref 70–99)
HCT: 41 % (ref 36.0–46.0)
HEMOGLOBIN: 13.9 g/dL (ref 12.0–15.0)
Potassium: 3.9 mEq/L (ref 3.7–5.3)
Sodium: 139 mEq/L (ref 137–147)
TCO2: 26 mmol/L (ref 0–100)

## 2013-09-17 LAB — PROTIME-INR
INR: 2.15 — ABNORMAL HIGH (ref 0.00–1.49)
Prothrombin Time: 23.3 seconds — ABNORMAL HIGH (ref 11.6–15.2)

## 2013-09-17 LAB — CBC WITH DIFFERENTIAL/PLATELET
Basophils Absolute: 0 10*3/uL (ref 0.0–0.1)
Basophils Relative: 0 % (ref 0–1)
EOS ABS: 0.1 10*3/uL (ref 0.0–0.7)
EOS PCT: 2 % (ref 0–5)
HEMATOCRIT: 38.8 % (ref 36.0–46.0)
Hemoglobin: 13.2 g/dL (ref 12.0–15.0)
LYMPHS ABS: 1.8 10*3/uL (ref 0.7–4.0)
Lymphocytes Relative: 27 % (ref 12–46)
MCH: 29.5 pg (ref 26.0–34.0)
MCHC: 34 g/dL (ref 30.0–36.0)
MCV: 86.6 fL (ref 78.0–100.0)
MONO ABS: 0.5 10*3/uL (ref 0.1–1.0)
Monocytes Relative: 7 % (ref 3–12)
Neutro Abs: 4.2 10*3/uL (ref 1.7–7.7)
Neutrophils Relative %: 64 % (ref 43–77)
PLATELETS: 209 10*3/uL (ref 150–400)
RBC: 4.48 MIL/uL (ref 3.87–5.11)
RDW: 13.3 % (ref 11.5–15.5)
WBC: 6.6 10*3/uL (ref 4.0–10.5)

## 2013-09-17 LAB — I-STAT TROPONIN, ED: Troponin i, poc: 0 ng/mL (ref 0.00–0.08)

## 2013-09-17 MED ORDER — IOHEXOL 300 MG/ML  SOLN: 80.0000 mL | Freq: Once | INTRAMUSCULAR | Status: DC | PRN

## 2013-09-17 MED ORDER — IOHEXOL 350 MG/ML SOLN
100.0000 mL | Freq: Once | INTRAVENOUS | Status: AC | PRN
  Administered 2013-09-17: 80 mL via INTRAVENOUS

## 2013-09-17 NOTE — ED Provider Notes (Signed)
CSN: [id number]     Arrival date & time 09/17/13  7829 History   First MD Initiated Contact with Patient 09/17/13 [id number]     Chief Complaint  Patient presents with  . Chest Pain     (Consider location/radiation/quality/duration/timing/severity/associated sxs/prior Treatment) HPI Elizabeth Barnes is a 33 y.o. female who presents to emergency department with complaint of left chest pain. Patient states she woke up with sharp pain in the left side of her chest. She states pain is worsened with movement and deep breathing. Chest is also tender to palpation. She denies any injuries. Patient has protein C. and S. deficiency as well as prothrombin deficiency disorder. 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 Patient is currently on Coumadin. Patient denies any shortness of breath. She denies any nausea, dizziness, diaphoresis. She denies any hemoptysis. She denies cough. She states this pain feels like when she had a PE. No medications taken prior to coming in.  Past Medical History  Diagnosis Date  . DVT (deep venous thrombosis) 2005    left leg with subsequent Pulmonary Embolis-on chronic coumadin, was d/c  1/06-then had another pe 6/06  . Pulmonary embolism 2005,2006    on chronic coumadin therapy-managed by [facility]  . Protein C deficiency   . PONV (postoperative nausea and vomiting)   . Protein S deficiency   . AC globulin factor II (prothrombin) deficiency    Past Surgical History  Procedure Laterality Date  . Cesarean section  2012  . Appendectomy      as child  . Tonsillectomy      as child  . Diagnostic laparoscopy  2008  . Dilation and curettage of uterus  2012    resection of polyp   History reviewed. No pertinent family history. History  Substance Use Topics  . Smoking status: Never Smoker   . Smokeless tobacco: Never Used  . Alcohol Use: No   OB History   Grav Para Term Preterm Abortions TAB SAB Ect Mult Living                 Review of Systems   Constitutional: Negative for fever and chills.  Respiratory: Positive for chest tightness. Negative for cough and shortness of breath.   Cardiovascular: Positive for chest pain. Negative for palpitations and leg swelling.  Gastrointestinal: Negative for nausea, vomiting, abdominal pain and diarrhea.  Genitourinary: Negative for dysuria, flank pain and pelvic pain.  Musculoskeletal: Negative for arthralgias, neck pain and neck stiffness.  Skin: Negative for rash.  Neurological: Negative for dizziness, weakness and headaches.  All other systems reviewed and are negative.     Allergies  Review of patient's allergies indicates no known allergies.  Home Medications   Prior to Admission medications   Medication Sig Start Date End Date Taking? Authorizing Provider  cholecalciferol (VITAMIN D) 1000 UNITS tablet Take 2,000 Units by mouth daily.     Yes Historical Provider, MD  Multiple Vitamin (MULITIVITAMIN WITH MINERALS) TABS Take 1 tablet by mouth daily.   Yes Historical Provider, MD  warfarin (COUMADIN) 5 MG tablet Take 7.5-10 mg by mouth daily at 6 PM. Monday, Wednesday, Friday and Saturday One and one-half tablets (7.5mg ) Sunday Tuesday and Thursday 2 tablets (10mg )   Yes Historical Provider, MD   BP 119/72  Pulse 74  Temp(Src) 98.2 F (36.8 C) (Oral)  Resp 15  SpO2 99%  LMP 07/16/2011 Physical Exam  Nursing note and vitals reviewed. Constitutional: She appears well-developed and well-nourished. No distress.  HENT:  Head: Normocephalic.  Eyes: Conjunctivae are normal.  Neck: Neck supple.  Cardiovascular: Normal rate, regular rhythm and normal heart sounds.   Pulmonary/Chest: Effort normal and breath sounds normal. No respiratory distress. She has no wheezes. She has no rales.  Abdominal: Soft. Bowel sounds are normal. She exhibits no distension. There is no tenderness. There is no rebound.  Musculoskeletal: She exhibits no edema.  Neurological: She is alert.  Skin: Skin is  warm and dry.  Psychiatric: She has a normal mood and affect. Her behavior is normal.    ED Course  Procedures (including critical care time) Labs Review Labs Reviewed  PROTIME-INR - Abnormal; Notable for the following:    Prothrombin Time 23.3 (*)    INR 2.15 (*)    All other components within normal limits  CBC WITH DIFFERENTIAL  I-STAT CHEM 8, ED  I-STAT TROPOININ, ED    Imaging Review Ct Angio Chest W/cm &/or Wo Cm  09/17/2013   CLINICAL DATA:  Left-sided chest pain. History of pulmonary embolism. On Coumadin.  EXAM: CT ANGIOGRAPHY CHEST WITH CONTRAST  TECHNIQUE: Multidetector CT imaging of the chest was performed using the standard protocol during bolus administration of intravenous contrast. Multiplanar CT image reconstructions and MIPs were obtained to evaluate the vascular anatomy.  CONTRAST:  80mL OMNIPAQUE IOHEXOL 350 MG/ML SOLN  COMPARISON:  03/28/2007  FINDINGS: No evidence of a pulmonary embolus. Heart is borderline enlarged but stable. Great vessels are unremarkable. No neck base, axillary, mediastinal or hilar masses or enlarged lymph nodes.  Lungs show mild subsegmental atelectasis but are otherwise clear. No pleural effusion. No pneumothorax.  Bony thorax is unremarkable.  Review of the MIP images confirms the above findings.  IMPRESSION: 1. No evidence of a pulmonary embolus. 2. No acute findings.   Electronically Signed   By: Amie Portlandavid  Ormond M.D.   On: 09/17/2013 12:04     EKG Interpretation   Date/Time:  Monday Sep 17 2013 09:30:40 EDT Ventricular Rate:  88 PR Interval:  144 QRS Duration: 84 QT Interval:  354 QTC Calculation: 428 R Axis:   53 Text Interpretation:  Normal sinus rhythm Normal ECG No significant change  since last tracing Confirmed by [facility]  MD, MARTHA [phone]([id number]) on 09/17/2013  1:35:39 PM      MDM   Final diagnoses:  Chest pain    Patient with clotting disorder, history of PEs, on Coumadin. Here with pain that she states feels exactly the same  as last time she had a PE. Will get labs including INR and CT of her chest.   INR is therapeutic. CT chest negative. Patient's vital signs are normal. She is in no distress. Home with ibuprofen or Tylenol for pain and followup outpatient as needed. Suspect she is having a chest wall pain.  Filed Vitals:   09/17/13 1100 09/17/13 1141 09/17/13 1200 09/17/13 1240  BP: 101/65 103/65 119/72 107/71  Pulse: 85 76 74 73  Temp:      TempSrc:      Resp: 12 18 15 16   SpO2: 99% 98% 99% 98%        Nikoloz Huy A Jhostin Epps, PA-C 09/17/13 1601

## 2013-09-17 NOTE — ED Notes (Signed)
Pt reports left side chest pain that started this am. Increases with movement and breathing, hx of PE x2.

## 2013-09-17 NOTE — Discharge Instructions (Signed)
Your lab work and CT look normal today. Continue coumadin. Your INR today is 2.15. Follow up with your doctor as needed. Tylenol or motrin for pain.    Chest Wall Pain Chest wall pain is pain in or around the bones and muscles of your chest. It may take up to 6 weeks to get better. It may take longer if you must stay physically active in your work and activities.  CAUSES  Chest wall pain may happen on its own. However, it may be caused by:  A viral illness like the flu.  Injury.  Coughing.  Exercise.  Arthritis.  Fibromyalgia.  Shingles. HOME CARE INSTRUCTIONS   Avoid overtiring physical activity. Try not to strain or perform activities that cause pain. This includes any activities using your chest or your abdominal and side muscles, especially if heavy weights are used.  Put ice on the sore area.  Put ice in a plastic bag.  Place a towel between your skin and the bag.  Leave the ice on for 15-20 minutes per hour while awake for the first 2 days.  Only take over-the-counter or prescription medicines for pain, discomfort, or fever as directed by your caregiver. SEEK IMMEDIATE MEDICAL CARE IF:   Your pain increases, or you are very uncomfortable.  You have a fever.  Your chest pain becomes worse.  You have new, unexplained symptoms.  You have nausea or vomiting.  You feel sweaty or lightheaded.  You have a cough with phlegm (sputum), or you cough up blood. MAKE SURE YOU:   Understand these instructions.  Will watch your condition.  Will get help right away if you are not doing well or get worse. Document Released: 04/12/2005 Document Revised: 07/05/2011 Document Reviewed: 12/07/2010 [facility] Patient Information 2014 [place], [place].

## 2013-09-17 NOTE — ED Notes (Addendum)
Denies SOB or exertion with ambulation. Currently taking coumadin for PE/DVT hx. Has skin biopsies for moles Wed morning at PCP.

## 2013-09-17 NOTE — ED Notes (Signed)
PA at bedside.

## 2013-09-20 NOTE — ED Provider Notes (Signed)
Medical screening examination/treatment/procedure(s) were performed by non-physician practitioner and as supervising physician I was immediately available for consultation/collaboration.   EKG Interpretation   Date/Time:  Monday Sep 17 2013 09:30:40 EDT Ventricular Rate:  88 PR Interval:  144 QRS Duration: 84 QT Interval:  354 QTC Calculation: 428 R Axis:   53 Text Interpretation:  Normal sinus rhythm Normal ECG No significant change  since last tracing Confirmed by Karma Ganja  MD, MARTHA [phone]) on 09/17/2013  1:35:39 PM       Ethelda Chick, MD 09/20/13 [phone]

## 2014-06-14 ENCOUNTER — Ambulatory Visit: Payer: Self-pay | Admitting: Skilled Nursing Facility1

## 2014-07-11 ENCOUNTER — Ambulatory Visit: Payer: Self-pay | Admitting: *Deleted

## 2014-08-08 ENCOUNTER — Encounter: Payer: BLUE CROSS/BLUE SHIELD | Attending: Obstetrics and Gynecology | Admitting: Skilled Nursing Facility1

## 2014-08-08 ENCOUNTER — Encounter: Payer: Self-pay | Admitting: Skilled Nursing Facility1

## 2014-08-08 VITALS — Ht 64.0 in | Wt 271.0 lb

## 2014-08-08 DIAGNOSIS — E669 Obesity, unspecified: Secondary | ICD-10-CM | POA: Diagnosis present

## 2014-08-08 DIAGNOSIS — Z713 Dietary counseling and surveillance: Secondary | ICD-10-CM | POA: Diagnosis not present

## 2014-08-08 DIAGNOSIS — Z6841 Body Mass Index (BMI) 40.0 and over, adult: Secondary | ICD-10-CM | POA: Insufficient documentation

## 2014-08-08 NOTE — Progress Notes (Signed)
  Medical Nutrition Therapy:  Appt start time: 9:45 end time:  10:45   Assessment:  Primary concerns today: referred for obesity. Pt wants to lose weight and does not want to be diabetic. 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 Diet Hx: the diets work for a day or so and then gets really hungry, tried Atkins for a week and lost 9 pounds stopped because online said it was unhealthy; tried counting calories at 1600 calories feels it was working well but husband suggest fast food and then she eats it. Pts states when counting calories after a while she feels starving-she states she is constantly hungry. Pt states her wt Usually stays around 230-240 pounds but she has recently gained some weight. Pt states she has had No significant changes in life.  Pt states: "Starving when I wake up, hungry all the time." Pt also has a diagnosis of PCOS. Pt accompanied by her friends four year old daughter.  Preferred Learning Style:   No preference indicated   Learning Readiness:   Ready   MEDICATIONS: See List   DIETARY INTAKE:  Usual eating pattern includes 3 meals and 4 snacks per day.  Everyday foods include calorically dense foods.  Avoided foods include non-starchy vegetables.    24-hr recall:  B ( AM): eggs---muffin---fruit Snk ( AM): L ( PM): fried or baked chicken in a sandwhich with tomatoes and pickles and chips Snk ( PM):  muffin----ice cream----candy D ( PM): pasta with chicken Snk ( PM): muffin----ice cream----candy Beverages: water, juice, soda  *Meals outside the home: 2 times a week  Usual physical activity: treadmill for an hour 7 days a week  Estimated energy needs: 1800 calories 200 g carbohydrates 135 g protein 50 g fat  Progress Towards Goal(s):  In progress.   Nutritional Diagnosis:  [place]-3.3 Overweight/obesity As related to  overconsumption of calorically dense foods.  As evidenced by pt report and BMI 46.52 .    Intervention:  Nutrition counseling for obesity. Dietitian educated pt on the importance of a balanced diet, non-starchy vegetables, and physical activity.  Goals:  -Try to put your treadmill at an incline and increase the speed-Be physically active every day -Try a new vegetable every week -Find some activities to do instead of eating; play with your daughter, clean, or exercise -Ensure your meals and snacks are balanced  Teaching Method Utilized:  Visual Auditory   Handouts given during visit include:  15 gram carbohydrate snack sheet  Barriers to learning/adherence to lifestyle change: lifestyle change  Demonstrated degree of understanding via:  Teach Back   Monitoring/Evaluation:  Dietary intake, exercise and body weight prn.

## 2014-08-08 NOTE — Patient Instructions (Signed)
-  Try to put your treadmill at an incline and increase the speed-Be physically active every day -Try a new vegetable every week -Find some activities to do instead of eating; play with your daughter, clean, or exercise -Ensure your meals and snacks are balanced

## 2016-02-03 ENCOUNTER — Encounter (HOSPITAL_COMMUNITY): Payer: Self-pay | Admitting: Emergency Medicine

## 2016-02-03 ENCOUNTER — Emergency Department (HOSPITAL_COMMUNITY)
Admission: EM | Admit: 2016-02-03 | Discharge: 2016-02-04 | Disposition: A | Discharge status: Home / Self Care | Payer: 59 | Attending: Emergency Medicine | Admitting: Emergency Medicine

## 2016-02-03 ENCOUNTER — Emergency Department (HOSPITAL_COMMUNITY): Payer: 59

## 2016-02-03 DIAGNOSIS — Z7901 Long term (current) use of anticoagulants: Secondary | ICD-10-CM | POA: Insufficient documentation

## 2016-02-03 DIAGNOSIS — R079 Chest pain, unspecified: Secondary | ICD-10-CM | POA: Insufficient documentation

## 2016-02-03 LAB — CBC
HEMATOCRIT: 40.3 % (ref 36.0–46.0)
Hemoglobin: 13.6 g/dL (ref 12.0–15.0)
MCH: 29.2 pg (ref 26.0–34.0)
MCHC: 33.7 g/dL (ref 30.0–36.0)
MCV: 86.5 fL (ref 78.0–100.0)
Platelets: 216 10*3/uL (ref 150–400)
RBC: 4.66 MIL/uL (ref 3.87–5.11)
RDW: 13.2 % (ref 11.5–15.5)
WBC: 6.6 10*3/uL (ref 4.0–10.5)

## 2016-02-03 LAB — BASIC METABOLIC PANEL
Anion gap: 10 (ref 5–15)
BUN: 11 mg/dL (ref 6–20)
CO2: 23 mmol/L (ref 22–32)
Calcium: 9.4 mg/dL (ref 8.9–10.3)
Chloride: 104 mmol/L (ref 101–111)
Creatinine, Ser: 0.69 mg/dL (ref 0.44–1.00)
GFR calc non Af Amer: >60 mL/min (ref >60)
Glucose, Bld: 85 mg/dL (ref 65–99)
Potassium: 3.9 mmol/L (ref 3.5–5.1)
Sodium: 137 mmol/L (ref 135–145)

## 2016-02-03 LAB — I-STAT TROPONIN, ED
TROPONIN I, POC: 0 ng/mL (ref 0.00–0.08)
Troponin i, poc: 0 ng/mL (ref 0.00–0.08)

## 2016-02-03 LAB — PROTIME-INR
INR: 1.99
Prothrombin Time: 22.9 seconds — ABNORMAL HIGH (ref 11.4–15.2)

## 2016-02-03 LAB — POC URINE PREG, ED: Preg Test, Ur: NEGATIVE

## 2016-02-03 MED ORDER — IOPAMIDOL (ISOVUE-370) INJECTION 76%
INTRAVENOUS | Status: AC
  Administered 2016-02-03: 80 mL
  Filled 2016-02-03: qty 100

## 2016-02-03 NOTE — Discharge Instructions (Signed)
Take 4 over the counter ibuprofen tablets 3 times a day or 2 over-the-counter naproxen tablets twice a day for pain. Also take tylenol 1000mg(2 extra strength) four times a day.    

## 2016-02-03 NOTE — ED Triage Notes (Signed)
Pt sts some mid sternal CP x 3 days and intermittent leg swelling x several weeks; pt sts hx of PE and takes coumadin

## 2016-02-03 NOTE — ED Provider Notes (Signed)
[facility]-EMERGENCY DEPT Provider Note   CSN: [id number] Arrival date & time: 02/03/16  1731     History   Chief Complaint Chief Complaint  Patient presents with  . Chest Pain    HPI Elizabeth Barnes is a 35 y.o. female.  The history is provided by the patient.  Chest Pain   This is a new problem. The current episode started yesterday. The problem occurs constantly. The problem has not changed since onset.The pain is associated with rest. The pain is present in the lateral region (left). The pain is mild. The quality of the pain is described as dull. The pain does not radiate. Duration of episode(s) is 2 days. Pertinent negatives include no abdominal pain, no back pain, no claudication, no cough, no diaphoresis, no dizziness, no exertional chest pressure, no fever, no headaches, no leg pain, no malaise/fatigue, no nausea, no near-syncope, no numbness, no palpitations, no shortness of breath, no vomiting and no weakness. Associated symptoms comments: Sensation she cannot catch a deep breath, but denies shortness of breath, denies pain is worse with deep breaths.. She has tried rest for the symptoms. The treatment provided no relief.  Her past medical history is significant for DVT and PE.  Pertinent negatives for past medical history include no CAD.    Past Medical History:  Diagnosis Date  . 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 DVT (deep venous thrombosis) (HCC) 2005   left leg with subsequent Pulmonary Embolis-on chronic coumadin, was d/c  1/06-then had another pe 6/06  . PONV (postoperative nausea and vomiting)   . Protein C deficiency (HCC)   . Protein S deficiency (HCC)   . Pulmonary embolism (HCC) 2005,2006   on chronic coumadin therapy-managed by [facility]    Patient Active Problem List   Diagnosis Date Noted  . PULMONARY EMBOLISM, HX OF 02/07/2007  . DIZZINESS 11/25/2006  . TRANSAMINASES, SERUM, ELEVATED 11/23/2006  . OTALGIA NOS  08/25/2006  . DIARRHEA 08/25/2006  . PE 07/08/2006  . POLYCYSTIC OVARY 06/23/2006  . OBESITY, NOS 06/23/2006  . DEPRESSIVE DISORDER, NOS 06/23/2006  . DEEP VEIN THROMBOPHLEBITIS, LEG 06/23/2006  . MENORRHAGIA 06/23/2006  . METRORRHAGIA 06/23/2006  . NECK PAIN 06/23/2006    Past Surgical History:  Procedure Laterality Date  . APPENDECTOMY     as child  . CESAREAN SECTION  2012  . DIAGNOSTIC LAPAROSCOPY  2008  . DILATION AND CURETTAGE OF UTERUS  2012   resection of polyp  . TONSILLECTOMY     as child    OB History    No data available       Home Medications    Prior to Admission medications   Medication Sig Start Date End Date Taking? Authorizing Provider  cholecalciferol (VITAMIN D) 1000 UNITS tablet Take 2,000 Units by mouth daily.     Yes Historical Provider, MD  Multiple Vitamin (MULITIVITAMIN WITH MINERALS) TABS Take 1 tablet by mouth daily.   Yes Historical Provider, MD  Omega-3 Fatty Acids (FISH OIL TRIPLE STRENGTH) 1400 MG CAPS Take 1,400 mg by mouth 2 (two) times daily.   Yes Historical Provider, MD  warfarin (COUMADIN) 7.5 MG tablet Take 7.5 mg by mouth daily.   Yes Historical Provider, MD    Family History Family History  Problem Relation Age of Onset  . Hypertension Other     Social History Social History  Substance Use Topics  . Smoking status: Never Smoker  . Smokeless tobacco: Never Used  .  Alcohol use No     Allergies   Review of patient's allergies indicates no known allergies.   Review of Systems Review of Systems  Constitutional: Negative for chills, diaphoresis, fever and malaise/fatigue.  HENT: Negative for congestion.   Respiratory: Negative for cough and shortness of breath.   Cardiovascular: Positive for chest pain. Negative for palpitations, claudication, leg swelling and near-syncope.  Gastrointestinal: Negative for abdominal pain, nausea and vomiting.  Genitourinary: Negative for flank pain.  Musculoskeletal: Negative for  back pain.  Skin: Negative for rash.  Neurological: Negative for dizziness, syncope, weakness, light-headedness, numbness and headaches.  All other systems reviewed and are negative.    Physical Exam Updated Vital Signs BP 116/84 (BP Location: Right Arm)   Pulse 72   Temp 98.3 F (36.8 C) (Oral)   Resp 16   LMP 06/10/2011   SpO2 99%   Physical Exam  Constitutional: She is oriented to person, place, and time. She appears well-developed and well-nourished. No distress.  Pleasant, cooperative, overweight otherwise well-appearing  HENT:  Head: Normocephalic and atraumatic.  Eyes: Conjunctivae are normal. No scleral icterus.  Neck: Normal range of motion. Neck supple. No JVD present.  Cardiovascular: Normal rate, regular rhythm and intact distal pulses.   Pulmonary/Chest: Effort normal and breath sounds normal. No respiratory distress. She exhibits no tenderness.  Abdominal: Soft. She exhibits no distension. There is no tenderness.  Musculoskeletal: She exhibits no edema or tenderness.  Symmetric size and appearance of b/l Le's, no calf swelling or tenderness. Trace non-pitting edema of b/l feet only  Neurological: She is alert and oriented to person, place, and time. She exhibits normal muscle tone. Coordination normal.  Skin: Skin is warm and dry. No rash noted. She is not diaphoretic.  Psychiatric: She has a normal mood and affect.  Nursing note and vitals reviewed.    ED Treatments / Results  Labs (all labs ordered are listed, but only abnormal results are displayed) Labs Reviewed  PROTIME-INR - Abnormal; Notable for the following:       Result Value   Prothrombin Time 22.9 (*)    All other components within normal limits  BASIC METABOLIC PANEL  CBC  URINALYSIS, ROUTINE W REFLEX MICROSCOPIC (NOT AT [facility])  I-STAT TROPOININ, ED  I-STAT TROPOININ, ED  POC URINE PREG, ED    EKG  EKG Interpretation  Date/Time:  Tuesday February 03 2016 17:37:26 EDT Ventricular Rate:    92 PR Interval:  154 QRS Duration: 80 QT Interval:  364 QTC Calculation: 450 R Axis:   49 Text Interpretation:  Normal sinus rhythm Normal ECG No significant change since last tracing Confirmed by FLOYD MD, DANIEL [id number]) on 02/03/2016 9:48:07 PM       Radiology Dg Chest 2 View  Result Date: 02/03/2016 CLINICAL DATA:  Initial evaluation for acute sharp chest pain. EXAM: CHEST  2 VIEW COMPARISON:  Prior CT from 09/17/2013. FINDINGS: The cardiac and mediastinal silhouettes are stable in size and contour, and remain within normal limits. The lungs are normally inflated. No airspace consolidation, pleural effusion, or pulmonary edema is identified. There is no pneumothorax. No acute osseous abnormality identified. IMPRESSION: No radiographic evidence for active cardiopulmonary disease. Electronically Signed   By: Rise Mu M.D.   On: 02/03/2016 18:11   Ct Angio Chest Pe W Or Wo Contrast  Result Date: 02/03/2016 CLINICAL DATA:  Acute onset of mid chest pain and shortness of breath. Initial encounter. EXAM: CT ANGIOGRAPHY CHEST WITH CONTRAST TECHNIQUE: Multidetector CT imaging of  the chest was performed using the standard protocol during bolus administration of intravenous contrast. Multiplanar CT image reconstructions and MIPs were obtained to evaluate the vascular anatomy. CONTRAST:  80 mL of Isovue 370 IV contrast COMPARISON:  Chest radiograph performed earlier today at 5:47 p.m., and CTA of the chest performed 09/17/2013 FINDINGS: Cardiovascular:  There is no evidence of pulmonary embolus. The heart is unremarkable in appearance. The thoracic aorta is grossly unremarkable. The great vessels are within normal limits. Mediastinum/Nodes: The mediastinum is unremarkable in appearance. No mediastinal lymphadenopathy is seen. No pericardial effusion is identified. The visualized portions of the thyroid gland are unremarkable. No axillary lymphadenopathy is seen. Lungs/Pleura: Minimal  bibasilar atelectasis is noted. The lungs are otherwise clear. No pleural effusion or pneumothorax is seen. A 5 mm nodule is noted at the right lung base (image 53 of 93). Upper Abdomen: The visualized portions of the liver are unremarkable. The spleen is enlarged, measuring 13.9 cm in length. A large stone is noted within the gallbladder. The gallbladder is otherwise unremarkable. The visualized portions of the pancreas, adrenal glands and kidneys are within normal limits. Musculoskeletal: No acute osseous abnormalities are identified. The visualized musculature is unremarkable in appearance. Review of the MIP images confirms the above findings. IMPRESSION: 1. No evidence of pulmonary embolus. 2. Minimal bibasilar atelectasis noted.  Lungs otherwise clear. 3. 5 mm nodule at the right lung base has changed only minimally in size from 2015 and is likely benign. 4. Cholelithiasis.  Gallbladder otherwise unremarkable. 5. Splenomegaly noted. Electronically Signed   By: Roanna Raider M.D.   On: 02/03/2016 23:33    Procedures Procedures (including critical care time)  Medications Ordered in ED Medications  iopamidol (ISOVUE-370) 76 % injection (80 mLs  Contrast Given 02/03/16 2217)     Initial Impression / Assessment and Plan / ED Course  I have reviewed the triage vital signs and the nursing notes.  Pertinent labs & imaging results that were available during my care of the patient were reviewed by me and considered in my medical decision making (see chart for details).  Clinical Course   JOSY PEADEN is a 35 y.o. female with h/o protein C & S deficiency, two prior Pe's (first provoked, second unprovoked in 2005), maintained on coumadin, who presents to ED for rapid-onset left-sided non-radiating chest discomfort while sitting down at work yesterday. No relation to exertion, no shortness of breath, no nausea, no diaphoresis, doubt ACS, normal EKG. CTA without evidence of Pe. INR 1.99, nearly  therapeutic. Unclear etiology of chest discomfort, could be stress-related with work, or MSK/chest wall pain. No recent URI sx, no pneumonia. Advised to f/u with PCP this week for recheck and to return to ER for any new, worse, or concerning symptoms.  Pt condition, course, and discharge were discussed with attending physician Dr. Melene Plan.  Final Clinical Impressions(s) / ED Diagnoses   Final diagnoses:  Chest pain  Nonspecific chest pain    New Prescriptions Discharge Medication List as of 02/03/2016 11:40 PM       Horald Pollen, MD 02/04/16 0108    Melene Plan, DO 02/04/16 1506    Melene Plan, DO 02/04/16 1506

## 2016-03-29 ENCOUNTER — Encounter: Payer: Self-pay | Admitting: Nurse Practitioner

## 2016-04-06 ENCOUNTER — Ambulatory Visit: Payer: 59 | Admitting: Nurse Practitioner

## 2016-04-08 ENCOUNTER — Ambulatory Visit (INDEPENDENT_AMBULATORY_CARE_PROVIDER_SITE_OTHER): Payer: 59 | Admitting: Nurse Practitioner

## 2016-04-08 ENCOUNTER — Encounter (INDEPENDENT_AMBULATORY_CARE_PROVIDER_SITE_OTHER): Payer: Self-pay

## 2016-04-08 ENCOUNTER — Encounter: Payer: Self-pay | Admitting: Nurse Practitioner

## 2016-04-08 VITALS — BP 124/72 | HR 94 | Ht 64.25 in | Wt 288.0 lb

## 2016-04-08 DIAGNOSIS — G8929 Other chronic pain: Secondary | ICD-10-CM | POA: Diagnosis not present

## 2016-04-08 DIAGNOSIS — K529 Noninfective gastroenteritis and colitis, unspecified: Secondary | ICD-10-CM

## 2016-04-08 DIAGNOSIS — R1011 Right upper quadrant pain: Secondary | ICD-10-CM | POA: Diagnosis not present

## 2016-04-08 NOTE — Patient Instructions (Addendum)
If you are age 34 or older, your body mass index should be between 23-30. Your Body mass index is 49.05 kg/m. If this is out of the aforementioned range listed, please consider follow up with your Primary Care Provider.  If you are age 34 or younger, your body mass index should be between 19-25. Your Body mass index is 49.05 kg/m. If this is out of the aformentioned range listed, please consider follow up with your Primary Care Provider.   We have given you samples of the following medication to take: IBGuard  Call the office at [phone] if the IBGuard doesn't work and we can try a abdominal cramping RX.  Follow up in the office in 3 weeks. Please call to schedule.  Thank you for choosing [facility]

## 2016-04-08 NOTE — Progress Notes (Signed)
HPI: Patient is a 35 year old female referred by Heide GuileLynn Lam, NP with [facility] in [place], [place] for evaluation of cholelithiasis. Patient has a history of protein C and protein S deficiency, DVT, and is chronically anticoagulated.   Patient was seen in the emergency department mid October with chest pain. She has no history of GERD . Denies regurgitation or pyrosis . ECG without significant change. Troponins were normal. Urine pregnancy test negative. CT scan negative for PE but cholelithiasis and splenomegaly noted. White count was normal as was hemoglobin. Platelets normal at 216. LFTs not done. Chest pain felt to be musculoskeletal.   Over the last couple of years patient has had frequent postprandial upper abdominal pain. Pain seems to originate in the right upper quadrant but then becomes more diffuse across the upper abdomen without radiation to the back.. She also has postprandial urgency and loose stool. Abdominal pain is relieved with defecation as well as splinting the right upper quadrant.. She does have some solid stools at times. No rectal bleeding. No weight loss.    Past Medical History:  Diagnosis Date  . 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 DVT (deep venous thrombosis) (HCC) 2005   left leg with subsequent Pulmonary Embolis-on chronic coumadin, was d/c  1/06-then had another pe 6/06  . PONV (postoperative nausea and vomiting)   . Protein C deficiency (HCC)   . Protein S deficiency (HCC)   . Pulmonary embolism (HCC) 2005,2006   on chronic coumadin therapy-managed by [facility]  . Pulmonary embolism [facility](HCC)     Past Surgical History:  Procedure Laterality Date  . APPENDECTOMY     as child  . CESAREAN SECTION  2012  . DIAGNOSTIC LAPAROSCOPY  2008  . DILATION AND CURETTAGE OF UTERUS  2012   resection of polyp  . HYSTEROTOMY    . TONSILLECTOMY     as child   Family History  Problem Relation Age of Onset  .  Hypertension Mother    Social History  Substance Use Topics  . Smoking status: Never Smoker  . Smokeless tobacco: Never Used  . Alcohol use No   Current Outpatient Prescriptions  Medication Sig Dispense Refill  . cholecalciferol (VITAMIN D) 1000 UNITS tablet Take 2,000 Units by mouth daily.      . Multiple Vitamin (MULITIVITAMIN WITH MINERALS) TABS Take 1 tablet by mouth daily.    . Omega-3 Fatty Acids (FISH OIL TRIPLE STRENGTH) 1400 MG CAPS Take 1,400 mg by mouth 2 (two) times daily.    Marland Kitchen. warfarin (COUMADIN) 7.5 MG tablet Take 7.5 mg by mouth daily.     No current facility-administered medications for this visit.    No Known Allergies   Review of Systems: All systems reviewed and negative except where noted in HPI.    Physical Exam: BP 124/72   Pulse 94   Ht 5' 4.25" (1.632 m)   Wt 288 lb (130.6 kg)   LMP 06/10/2011   BMI 49.05 kg/m  Constitutional:  Obese, white female in no acute distress. Psychiatric: Normal mood and affect. Behavior is normal. HEENT: Normocephalic and atraumatic. Conjunctivae are normal. No scleral icterus. Neck supple.  Cardiovascular: Normal rate, regular rhythm.  Pulmonary/chest: Effort normal and breath sounds normal. No wheezing, rales or rhonchi. Abdominal: Soft, nondistended, nontender. Bowel sounds active throughout. There are no masses palpable. No hepatomegaly. Extremities: no edema Lymphadenopathy: No cervical adenopathy noted. Neurological: Alert and oriented to person place and  time. Skin: Skin is warm and dry. No rashes noted.   ASSESSMENT AND PLAN: 6[birth year]  35 year old female with two-year history of postprandial diffuse, nonradiating upper abdominal pain which seems to begin in the RUQ and is relieved with defecation. She has associated postprandial loose stool with urgency almost daily though does have some normal bowel movements as well. No blood in stool, no weight loss -Symptoms seem most compatible with irritable bowel syndrome.  Trial of IBguard, if not helpful she will call and will try a bowel antispasmotic.  -return for reevaluation in 2-3 weeks.   2. Cholelithiasis, probable incidental finding onf CTangio done in October for chest pain. She may need surgical referral in near future if GI workup otherwise negative.   3. Protein C and Protein S deficiency / DVT /PE 2005. On chronic coumadin.   Willette ClusterPaula Pamala Hayman, NP  04/08/2016, 10:17 AM  Cc:  Ronal FearLam, Lynn E, NP

## 2016-04-09 NOTE — Progress Notes (Signed)
Note reviewed. It is possible her symptoms are biliary colic although with atypical presentation. I would recommend checking her LFTs to ensure normal. "Large gallstone" noted on CT, would be helpful to have radiology measure size, as for some gallstones that are quite large, surgery is recommended regardless of symptoms. We should follow her up and to determine if she warrants referral to general surgery. Thanks

## 2016-04-29 ENCOUNTER — Ambulatory Visit: Payer: 59 | Admitting: Nurse Practitioner

## 2020-01-31 ENCOUNTER — Other Ambulatory Visit: Payer: Self-pay | Admitting: Sports Medicine

## 2020-01-31 DIAGNOSIS — M545 Low back pain, unspecified: Secondary | ICD-10-CM

## 2020-02-22 ENCOUNTER — Ambulatory Visit
Admission: RE | Admit: 2020-02-22 | Discharge: 2020-02-22 | Disposition: A | Discharge status: Home / Self Care | Payer: 59 | Source: Ambulatory Visit | Attending: Sports Medicine | Admitting: Sports Medicine

## 2020-02-22 ENCOUNTER — Other Ambulatory Visit: Payer: Self-pay

## 2020-02-22 DIAGNOSIS — M545 Low back pain, unspecified: Secondary | ICD-10-CM

## 2023-01-04 ENCOUNTER — Other Ambulatory Visit: Payer: Self-pay

## 2023-01-04 ENCOUNTER — Emergency Department (HOSPITAL_COMMUNITY): Payer: 59

## 2023-01-04 ENCOUNTER — Emergency Department (HOSPITAL_COMMUNITY)
Admission: EM | Admit: 2023-01-04 | Discharge: 2023-01-05 | Disposition: A | Discharge status: Home / Self Care | Payer: 59 | Attending: Emergency Medicine | Admitting: Emergency Medicine

## 2023-01-04 DIAGNOSIS — I8391 Asymptomatic varicose veins of right lower extremity: Secondary | ICD-10-CM | POA: Diagnosis not present

## 2023-01-04 DIAGNOSIS — Z7901 Long term (current) use of anticoagulants: Secondary | ICD-10-CM | POA: Diagnosis not present

## 2023-01-04 DIAGNOSIS — D6859 Other primary thrombophilia: Secondary | ICD-10-CM | POA: Diagnosis not present

## 2023-01-04 DIAGNOSIS — M7989 Other specified soft tissue disorders: Secondary | ICD-10-CM | POA: Diagnosis present

## 2023-01-04 LAB — CBC
HCT: 36.7 % (ref 36.0–46.0)
Hemoglobin: 12.1 g/dL (ref 12.0–15.0)
MCH: 29.4 pg (ref 26.0–34.0)
MCHC: 33 g/dL (ref 30.0–36.0)
MCV: 89.1 fL (ref 80.0–100.0)
Platelets: UNDETERMINED 10*3/uL (ref 150–400)
RBC: 4.12 MIL/uL (ref 3.87–5.11)
RDW: 12.8 % (ref 11.5–15.5)
WBC: 5.4 10*3/uL (ref 4.0–10.5)
nRBC: 0 % (ref 0.0–0.2)

## 2023-01-04 NOTE — ED Triage Notes (Signed)
Pt arrives to ED c/o right leg pain/warmth that has been present for 2 hours. Pt reports hx of DVT and PE. Pt states that she can feel bump on leg and the area is warm to touch. Pt is on warfarin

## 2023-01-05 ENCOUNTER — Ambulatory Visit (HOSPITAL_BASED_OUTPATIENT_CLINIC_OR_DEPARTMENT_OTHER)
Admission: RE | Admit: 2023-01-05 | Discharge: 2023-01-05 | Disposition: A | Discharge status: Home / Self Care | Payer: 59 | Source: Ambulatory Visit | Attending: Emergency Medicine | Admitting: Emergency Medicine

## 2023-01-05 DIAGNOSIS — D6859 Other primary thrombophilia: Secondary | ICD-10-CM | POA: Insufficient documentation

## 2023-01-05 DIAGNOSIS — Z7901 Long term (current) use of anticoagulants: Secondary | ICD-10-CM | POA: Insufficient documentation

## 2023-01-05 DIAGNOSIS — M79661 Pain in right lower leg: Secondary | ICD-10-CM

## 2023-01-05 DIAGNOSIS — R2241 Localized swelling, mass and lump, right lower limb: Secondary | ICD-10-CM | POA: Insufficient documentation

## 2023-01-05 LAB — BASIC METABOLIC PANEL
Anion gap: 15 (ref 5–15)
BUN: 13 mg/dL (ref 6–20)
CO2: 18 mmol/L — ABNORMAL LOW (ref 22–32)
Calcium: 9.4 mg/dL (ref 8.9–10.3)
Chloride: 105 mmol/L (ref 98–111)
Creatinine, Ser: 0.7 mg/dL (ref 0.44–1.00)
GFR, Estimated: >60 mL/min (ref >60)
Glucose, Bld: 101 mg/dL — ABNORMAL HIGH (ref 70–99)
Potassium: 4.4 mmol/L (ref 3.5–5.1)
Sodium: 138 mmol/L (ref 135–145)

## 2023-01-05 LAB — PROTIME-INR
INR: 2.1 — ABNORMAL HIGH (ref 0.8–1.2)
Prothrombin Time: 23.6 s — ABNORMAL HIGH (ref 11.4–15.2)

## 2023-01-05 LAB — TROPONIN I (HIGH SENSITIVITY)
Troponin I (High Sensitivity): 4 ng/L (ref <18)
Troponin I (High Sensitivity): 4 ng/L (ref <18)

## 2023-01-05 NOTE — Discharge Instructions (Addendum)
Your coagulation studies today:  PT: 23.6 INR: 2.1

## 2023-01-06 NOTE — ED Provider Notes (Signed)
[facility]ERGENC[facility] DEPARTMENT AT [facility] Provider Note   CSN: [id number] Arrival date & time: 01/04/23  2226     History  Chief Complaint  Patient presents with   Leg Pain    MILINDA HOTTINGER is a 42 y.o. female.  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  Patient states that she has had a bulging bluish area on her right medial leg for a while but then today she noticed that it was more swollen and a little erythematous.  She states that she is concerned it might be a DVT so she presents here for further evaluation.  Erythema has improved.  She does not have any whole leg erythema or pain.  She was recently supratherapeutic on her Coumadin so she did skip a couple days to try to get back under control.   Leg Pain      Home Medications Prior to Admission medications   Medication Sig Start Date End Date Taking? Authorizing Provider  cholecalciferol (VITAMIN D) 1000 UNITS tablet Take 2,000 Units by mouth daily.      [provider]  Multiple Vitamin (MULITIVITAMIN WITH MINERALS) TABS Take 1 tablet by mouth daily.    [provider]  Omega-3 Fatty Acids (FISH OIL TRIPLE STRENGTH) 1400 MG CAPS Take 1,400 mg by mouth 2 (two) times daily.    [provider]  warfarin (COUMADIN) 7.5 MG tablet Take 7.5 mg by mouth daily.    [provider]      Allergies    Patient has no known allergies.    Review of Systems   Review of Systems  Physical Exam Updated Vital Signs BP (!) 144/88 (BP Location: Left Arm)   Pulse 72   Temp 98.1 F (36.7 C)   Resp 18   Ht 5\' 4"  (1.626 m)   Wt (!) 145.2 kg   LMP 06/10/2011   SpO2 100%   BMI 54.93 kg/m  Physical Exam Vitals and nursing note reviewed.  Constitutional:      Appearance: She is well-developed.  HENT:     Head: Normocephalic  and atraumatic.     Nose: No congestion or rhinorrhea.     Mouth/Throat:     Pharynx: No oropharyngeal exudate.  Eyes:     Pupils: Pupils are equal, round, and reactive to light.  Cardiovascular:     Rate and Rhythm: Normal rate and regular rhythm.  Pulmonary:     Effort: No respiratory distress.     Breath sounds: No stridor.  Abdominal:     General: There is no distension.  Musculoskeletal:     Cervical back: Normal range of motion.  Skin:    Comments: Moderate sized varicosity on her right medial calf without tenderness, erythema or pulsatility.  Rest of the leg is equal sized to the left with intact pulses no calf tenderness or pain with dorsiflexion/plantarflexion of foot.  Neurological:     General: No focal deficit present.     Mental Status: She is alert.     ED Results / Procedures / Treatments   Labs (all labs ordered are listed, but only abnormal results are displayed) Labs Reviewed  BASIC METABOLIC PANEL - Abnormal; Notable for the following components:      Result Value   CO2 18 (*)    Glucose, Bld 101 (*)  All other components within normal limits  PROTIME-INR - Abnormal; Notable for the following components:   Prothrombin Time 23.6 (*)    INR 2.1 (*)    All other components within normal limits  CBC  TROPONIN I (HIGH SENSITIVITY)  TROPONIN I (HIGH SENSITIVITY)    EKG None  Radiology LE VENOUS  Result Date: 01/05/2023  Lower Venous DVT Study Patient Name:  AZLYNN DUELLMAN  Date of Exam:   01/05/2023 Medical Rec #: [id number]       Accession #:    [id number] Date of Birth: 1981/03/13        Patient Gender: F Patient Age:   10 years Exam Location:  [facility] Procedure:      VAS [place] LOWER EXTREMITY VENOUS (DVT) Referring Phys: Barbara Cower Idolina Mantell --------------------------------------------------------------------------------  Indications: Palpable knot/bruise to right medial calf. Other Indications: Patient on warfarin- paused for 2 days due to INR >3.  History                    of DVT/PE in 2005-2006. Risk Factors: Protein C deficiency and protein S deficiency. Comparison Study: No prior studies available. Performing Technologist: Jean Rosenthal RDMS, RVT  Examination Guidelines: A complete evaluation includes B-mode imaging, spectral Doppler, color Doppler, and power Doppler as needed of all accessible portions of each vessel. Bilateral testing is considered an integral part of a complete examination. Limited examinations for reoccurring indications may be performed as noted. The reflux portion of the exam is performed with the patient in reverse Trendelenburg.  +---------+---------------+---------+-----------+----------+--------------+ RIGHT    CompressibilityPhasicitySpontaneityPropertiesThrombus Aging +---------+---------------+---------+-----------+----------+--------------+ CFV      Full           Yes      Yes                                 +---------+---------------+---------+-----------+----------+--------------+ SFJ      Full                                                        +---------+---------------+---------+-----------+----------+--------------+ FV Prox  Full                                                        +---------+---------------+---------+-----------+----------+--------------+ FV Mid   Full                                                        +---------+---------------+---------+-----------+----------+--------------+ FV DistalFull           Yes      Yes                                 +---------+---------------+---------+-----------+----------+--------------+ PFV      Full                                                        +---------+---------------+---------+-----------+----------+--------------+  POP      Full           Yes      Yes                                 +---------+---------------+---------+-----------+----------+--------------+ PTV      Full                                                         +---------+---------------+---------+-----------+----------+--------------+ PERO     Full                                                        +---------+---------------+---------+-----------+----------+--------------+   +----+---------------+---------+-----------+----------+--------------+ LEFTCompressibilityPhasicitySpontaneityPropertiesThrombus Aging +----+---------------+---------+-----------+----------+--------------+ CFV Full           Yes      Yes                                 +----+---------------+---------+-----------+----------+--------------+    Summary: RIGHT: - There is no evidence of deep vein thrombosis in the lower extremity.  - No cystic structure found in the popliteal fossa.  LEFT:  - No evidence of common femoral vein obstruction.   *See table(s) above for measurements and observations.    Preliminary    DG Tibia/Fibula Right  Result Date: 01/04/2023 CLINICAL DATA:  Leg pain EXAM: RIGHT TIBIA AND FIBULA - 2 VIEW COMPARISON:  None Available. FINDINGS: Generalized soft tissue edema. No fracture or malalignment. No periostitis. No soft tissue emphysema IMPRESSION: No acute osseous abnormality Electronically Signed   By: Jasmine Pang M.D.   On: 01/04/2023 23:31    Procedures Procedures    Medications Ordered in ED Medications - No data to display  ED Course/ Medical Decision Making/ A&P                                 Medical Decision Making Amount and/or Complexity of Data Reviewed Labs: ordered. Radiology: ordered.   Patient unlikely to have DVT with her physical exam findings and being therapeutic on her Coumadin but with her history of.  Protein C and S deficiency and high level of concern outpatient ultrasound was ordered as we do not currently have ultrasound available here. Will return here or d/w her hematologist if it is positive for further instruction.    Final Clinical Impression(s) / ED  Diagnoses Final diagnoses:  Asymptomatic varicose veins of right lower extremity    Rx / DC Orders ED Discharge Orders          Ordered    VAS [place] LOWER EXTREMITY VENOUS (DVT)        01/05/23 0536    LE VENOUS        01/05/23 0537              Paz Fuentes, Barbara Cower, MD 01/06/23 [id number]
